# Patient Record
Sex: Female | Born: 1970 | Race: White | Hispanic: No | Marital: Married | Smoking: Never smoker
Health system: Southern US, Community
[De-identification: ages and names within clinical notes are randomized; demographics above are authoritative.]

## PROBLEM LIST (undated history)

## (undated) DIAGNOSIS — N200 Calculus of kidney: Secondary | ICD-10-CM

## (undated) HISTORY — PX: OVARIAN CYST REMOVAL: SHX89

## (undated) HISTORY — DX: Calculus of kidney: N20.0

---

## 2002-09-29 ENCOUNTER — Other Ambulatory Visit: Admission: RE | Admit: 2002-09-29 | Discharge: 2002-09-29 | Payer: Self-pay | Admitting: Obstetrics and Gynecology

## 2003-11-10 ENCOUNTER — Ambulatory Visit (HOSPITAL_BASED_OUTPATIENT_CLINIC_OR_DEPARTMENT_OTHER): Admission: RE | Admit: 2003-11-10 | Discharge: 2003-11-10 | Payer: Self-pay | Admitting: Urology

## 2003-11-10 IMAGING — CR DG ABDOMEN 1V
1 series · 1 of 1 positions shown · non-contrast
Comparison: none

CLINICAL DATA: UPJ stone.
 ABDOMEN, ONE VIEW
 There is a 4 x 5 mm stone in the region of the proximal left ureter at the region of the left L2 transverse process.  No other renal calculi are identified.  The bowel gas pattern is normal. 
 IMPRESSION
 4 x 5 mm calculus proximal left ureter.

[view not recorded]
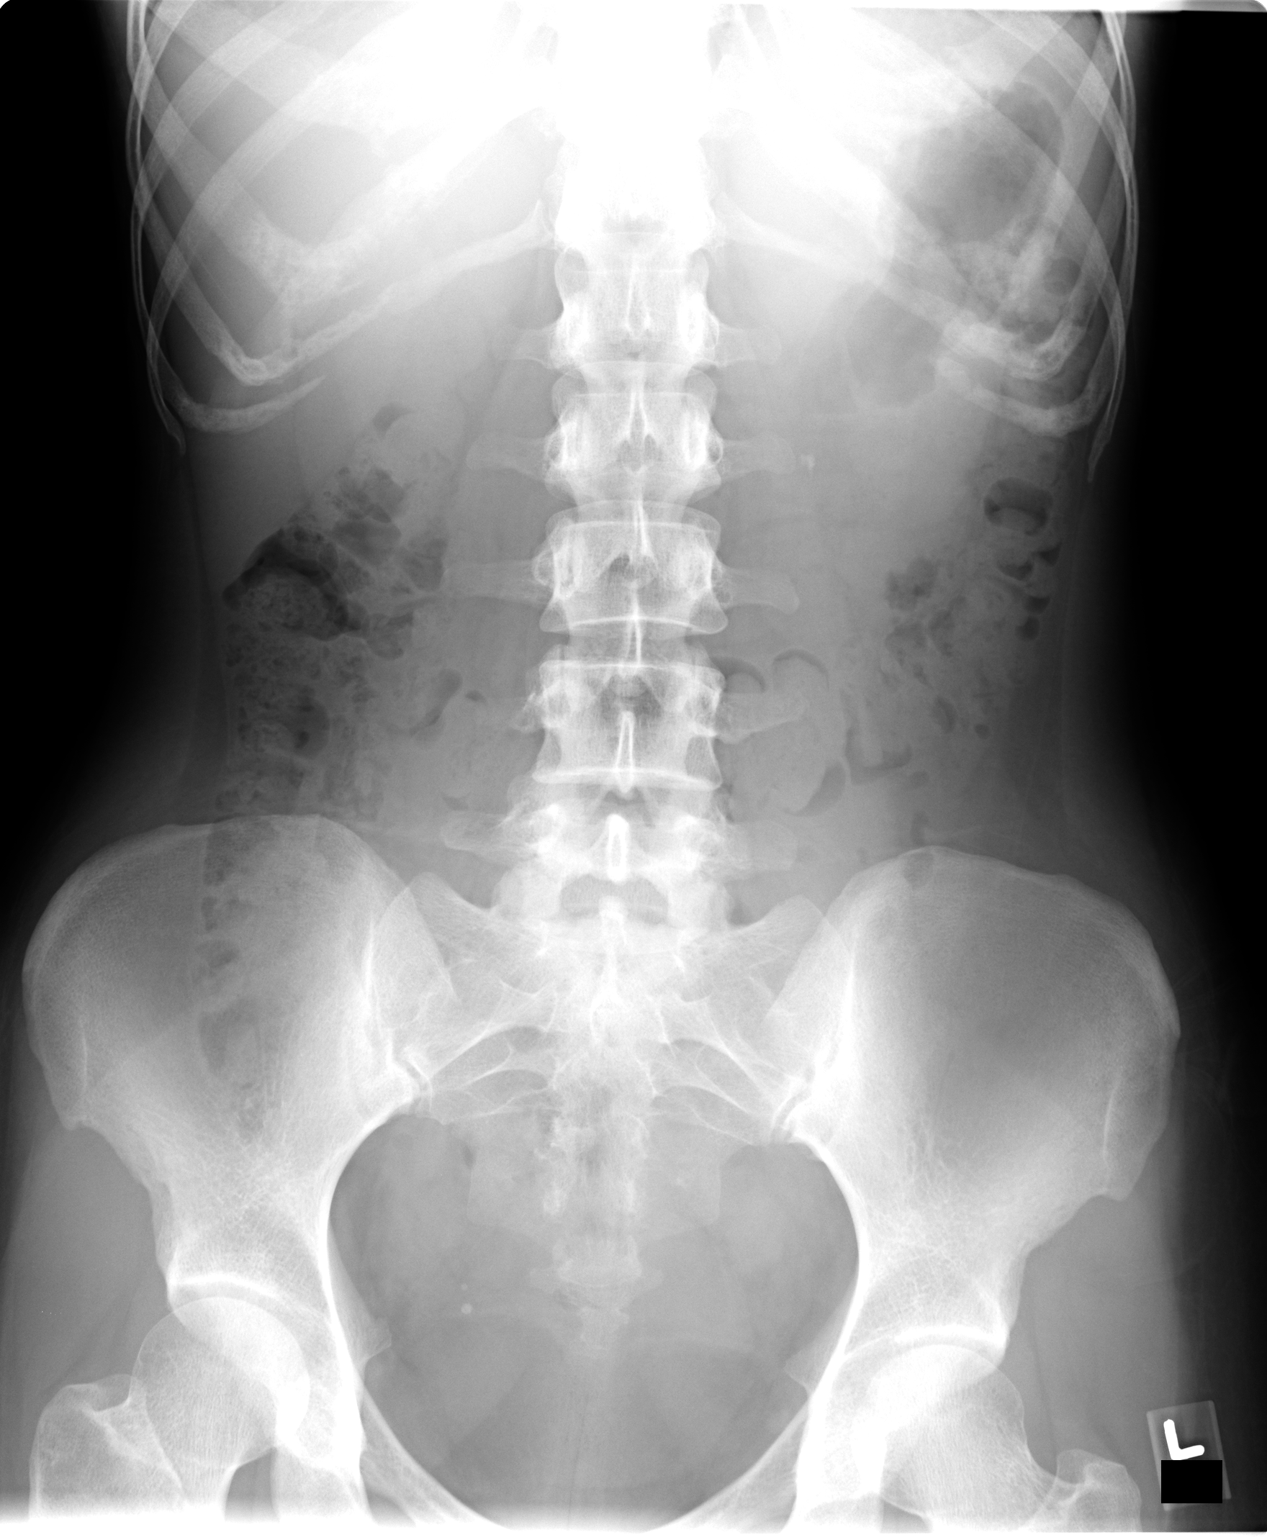

[1 of 1 positions shown; findings below may reference images not displayed]

## 2003-11-11 ENCOUNTER — Ambulatory Visit (HOSPITAL_COMMUNITY): Admission: RE | Admit: 2003-11-11 | Discharge: 2003-11-11 | Payer: Self-pay | Admitting: Urology

## 2003-11-11 IMAGING — CR DG ABDOMEN 1V
1 series · 1 of 1 positions shown · non-contrast
Comparison: [DATE].

CLINICAL DATA: Left flank pain.
 ABDOMEN, ONE VIEW (KUB)

[view not recorded]
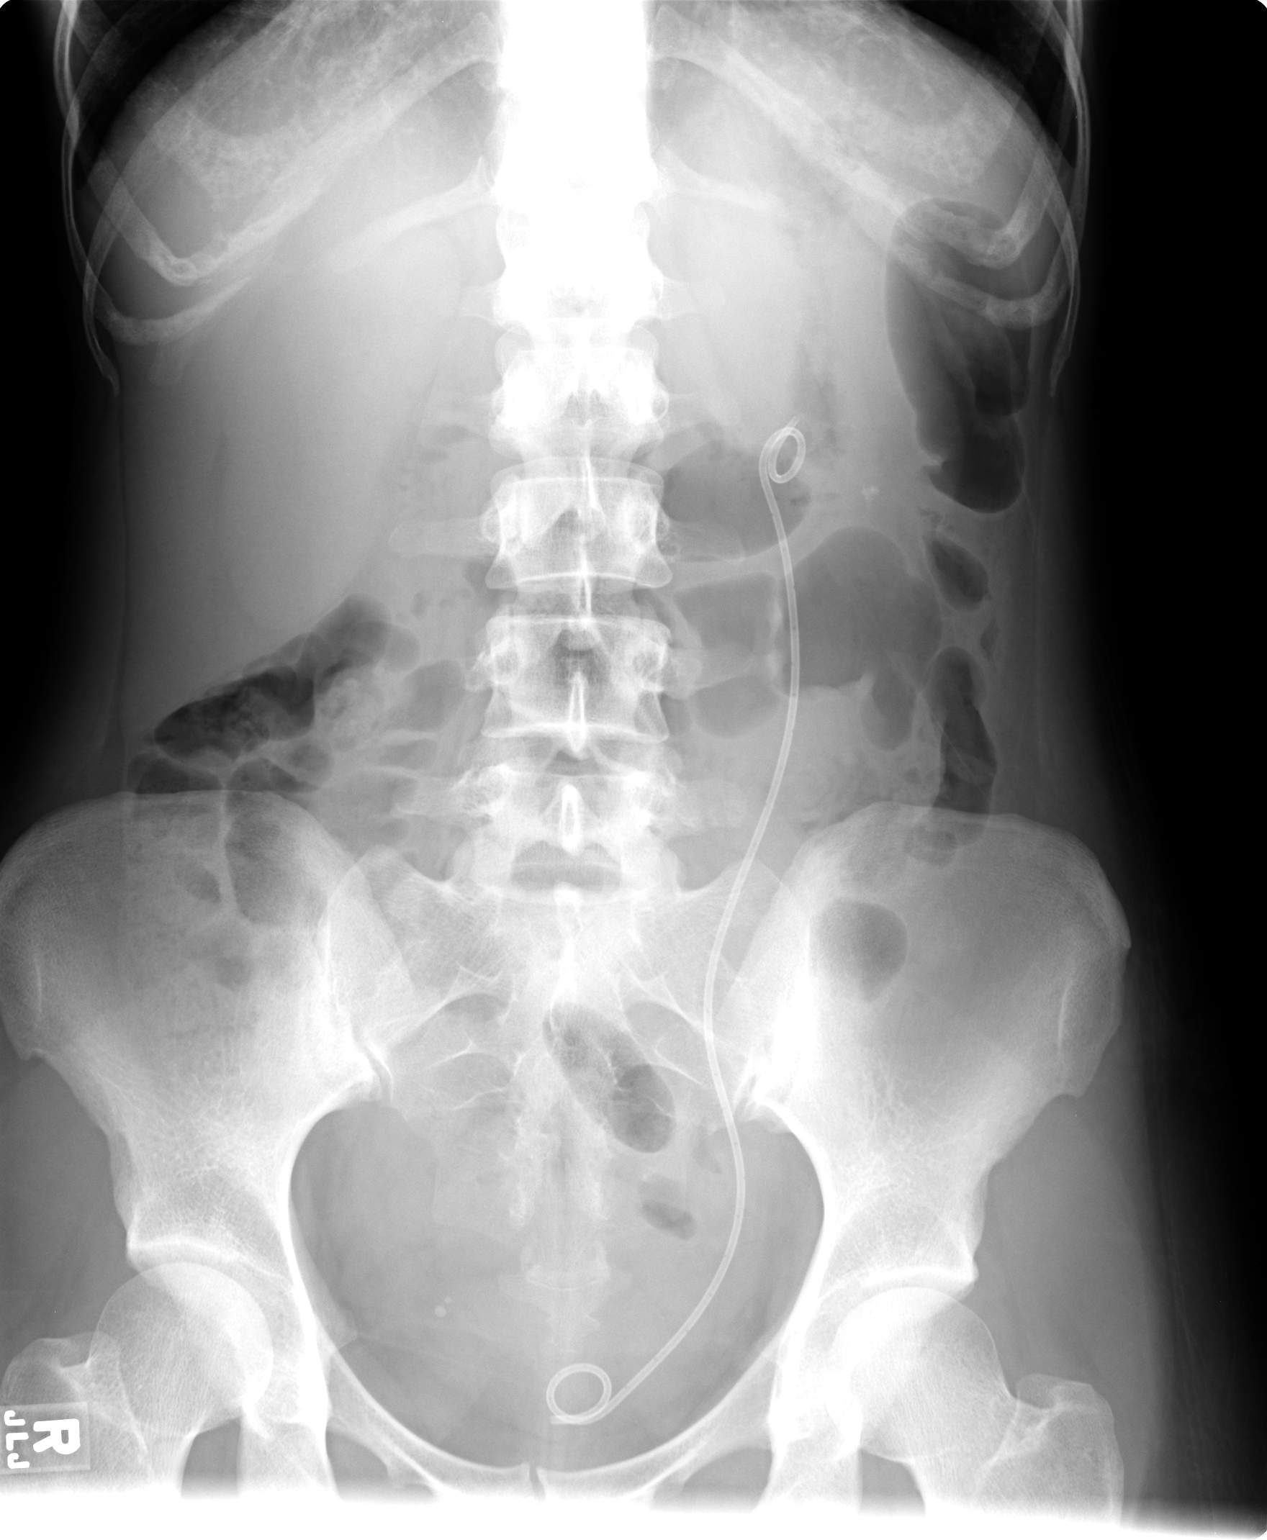

[1 of 1 positions shown; findings below may reference images not displayed]

A left double-J ureteral stent has been placed.  The calculus that was in the proximal left ureter is now positioned in a lower pole calyx of the left kidney. 
 IMPRESSION
 1.  Status post left double-J stent placement. 
 2.  Former proximal left ureteral calculus is now positioned in the lower pole of the left kidney.

## 2006-01-28 ENCOUNTER — Encounter: Admission: RE | Admit: 2006-01-28 | Discharge: 2006-04-28 | Payer: Self-pay | Admitting: Otolaryngology

## 2006-08-23 ENCOUNTER — Observation Stay (HOSPITAL_COMMUNITY): Admission: EM | Admit: 2006-08-23 | Discharge: 2006-08-23 | Payer: Self-pay | Admitting: Emergency Medicine

## 2006-09-04 ENCOUNTER — Emergency Department (HOSPITAL_COMMUNITY): Admission: EM | Admit: 2006-09-04 | Discharge: 2006-09-04 | Payer: Self-pay | Admitting: Emergency Medicine

## 2006-09-04 IMAGING — CT CT HEAD W/O CM
1 of 2 series · 16 of 30 positions shown, 20 images · IV contrast (agent unspecified)
Comparison: none

CLINICAL DATA: Altered mental status.  Confusion.  
 HEAD CT WITHOUT CONTRAST:
TECHNIQUE: Contiguous axial images were obtained from the base of the skull through the vertex according to standard protocol without contrast.

[Series 2: headseq 4.8 h45s · axial · 0.43mm/px · z∈[+1177,+1301]mm · 16 of 30 slices shown, 20 images]
[im 2/30  brain]
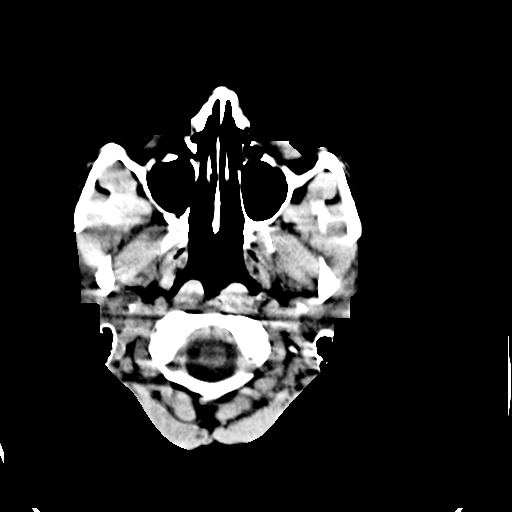
[im 2/30  bone]
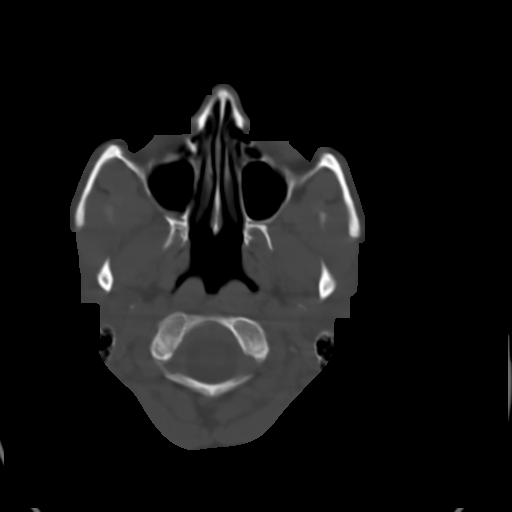
[im 3/30  brain]
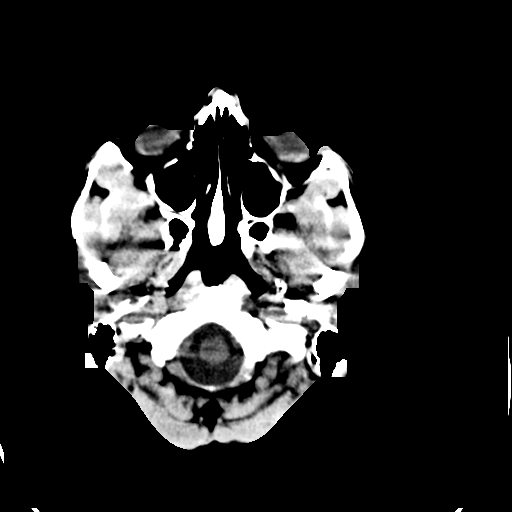
[im 5/30  brain]
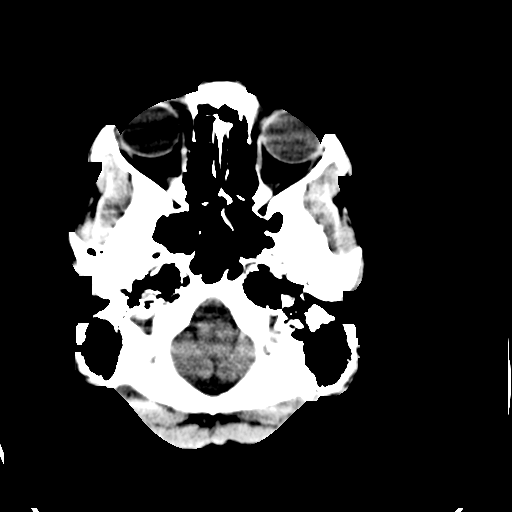
[im 8/30  brain]
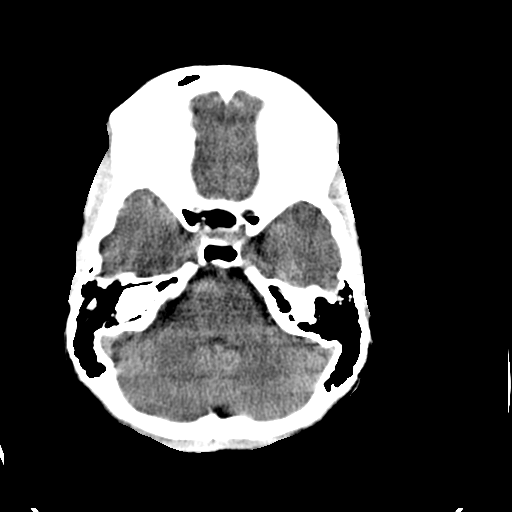
[im 9/30  brain]
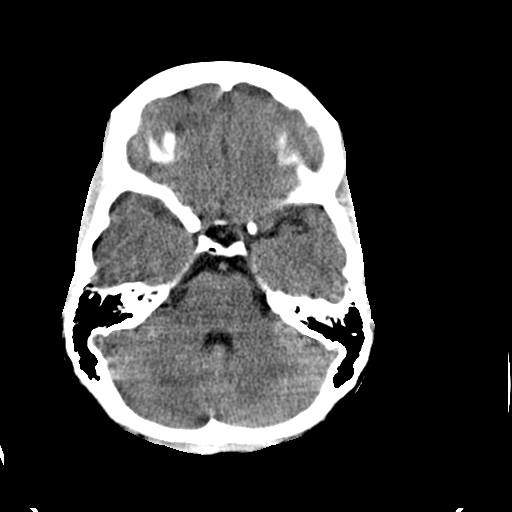
[im 9/30  bone]
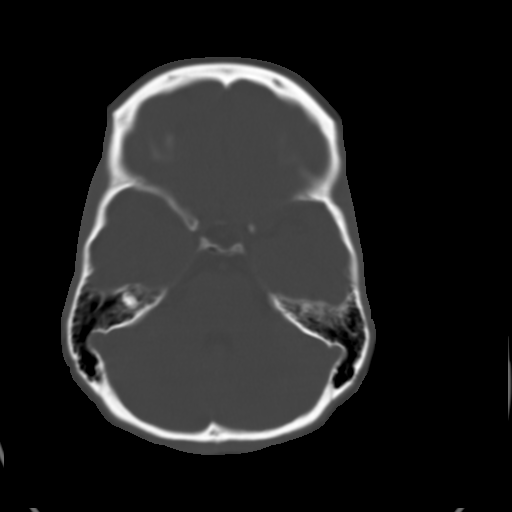
[im 11/30  brain]
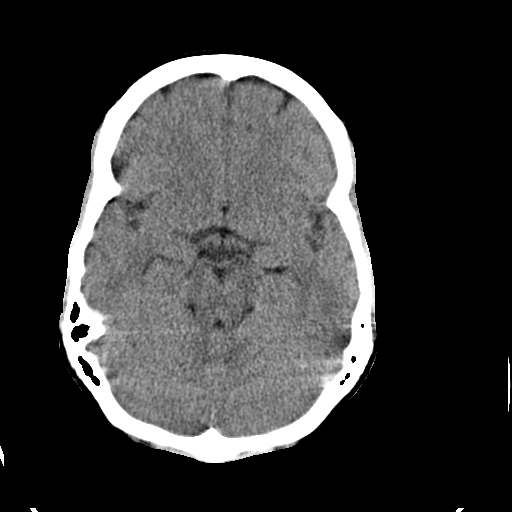
[im 12/30  brain]
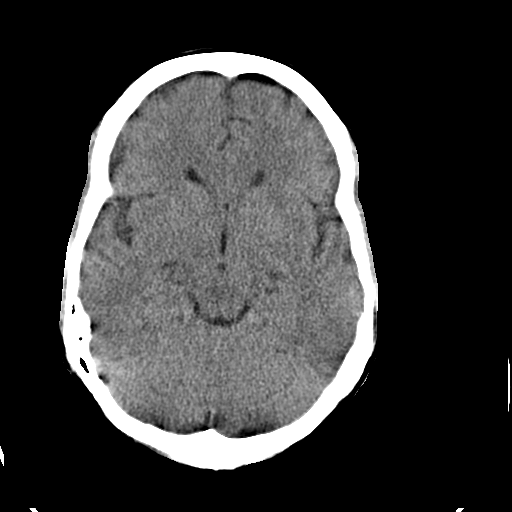
[im 14/30  brain]
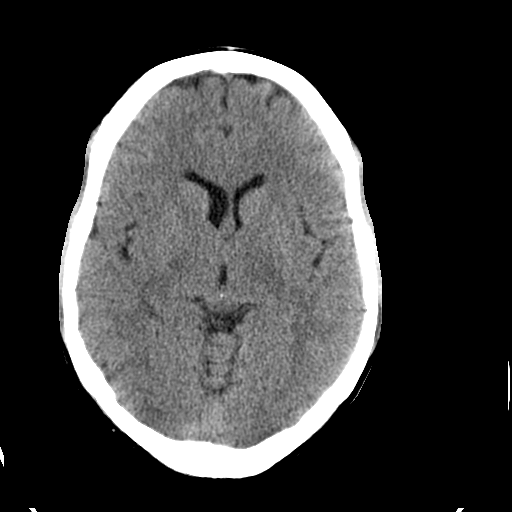
[im 16/30  brain]
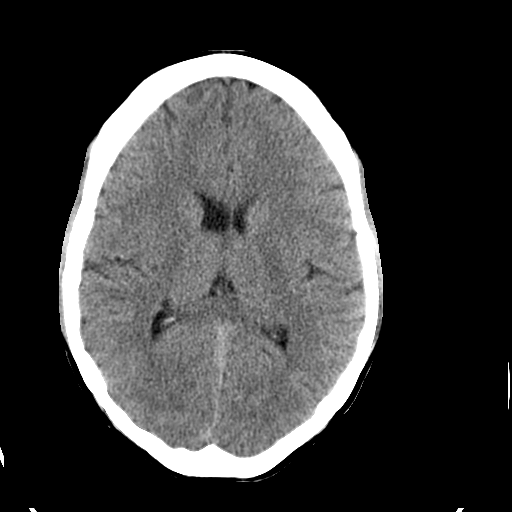
[im 16/30  bone]
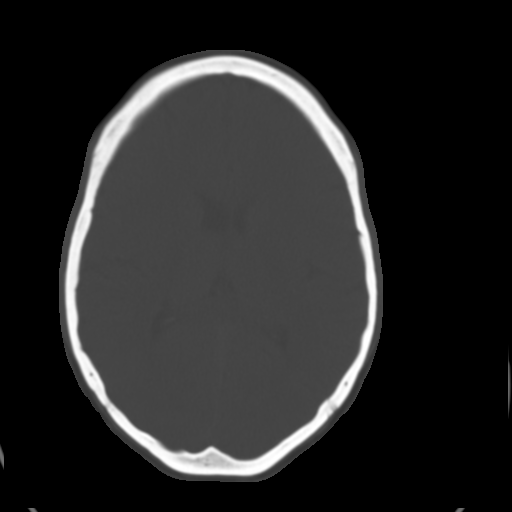
[im 18/30  brain]
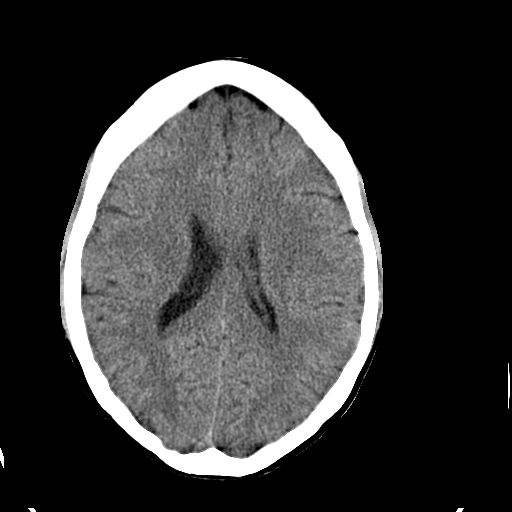
[im 19/30  brain]
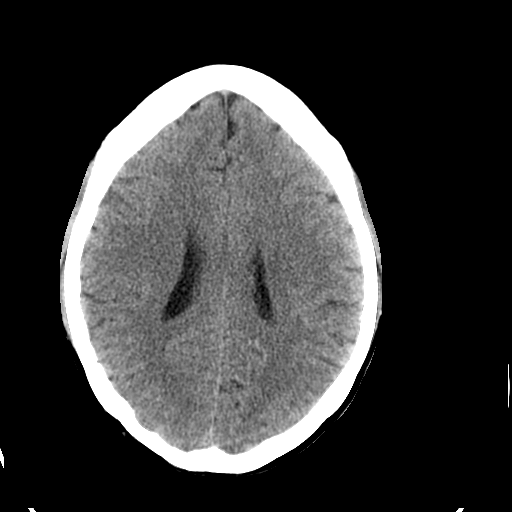
[im 21/30  brain]
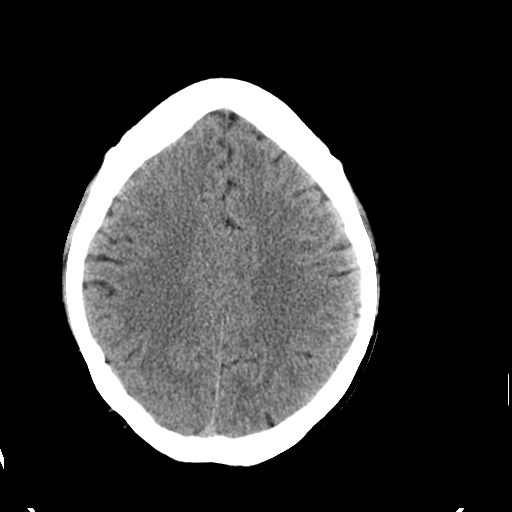
[im 22/30  brain]
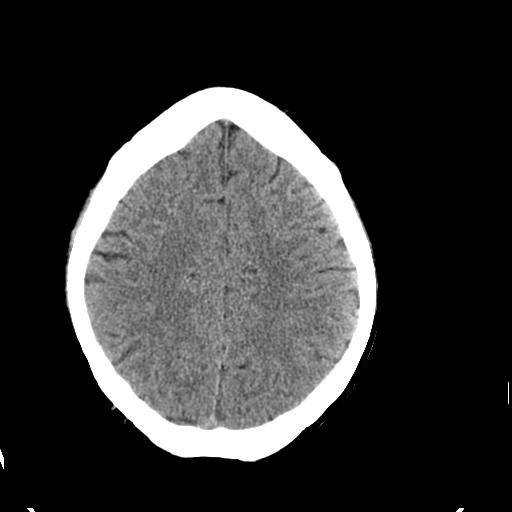
[im 22/30  bone]
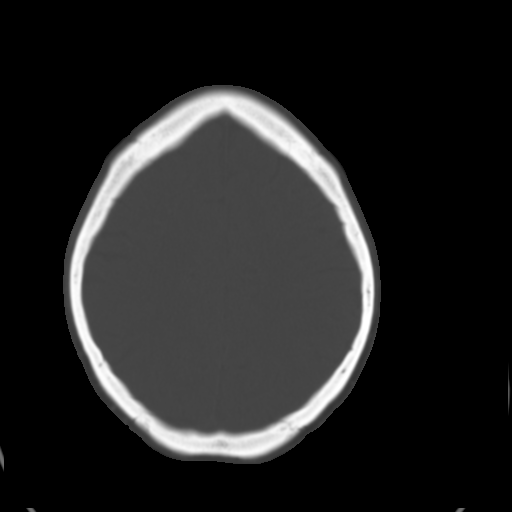
[im 25/30  brain]
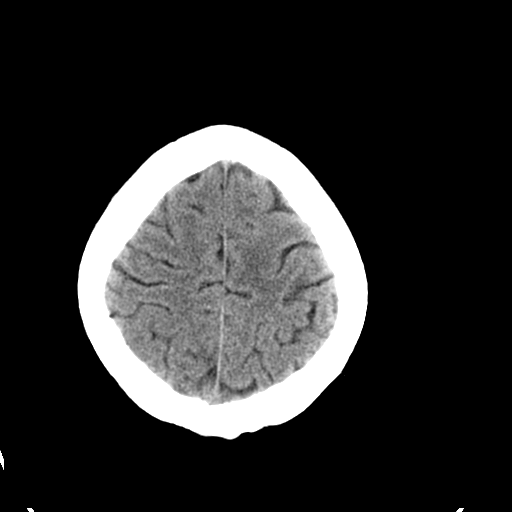
[im 27/30  brain]
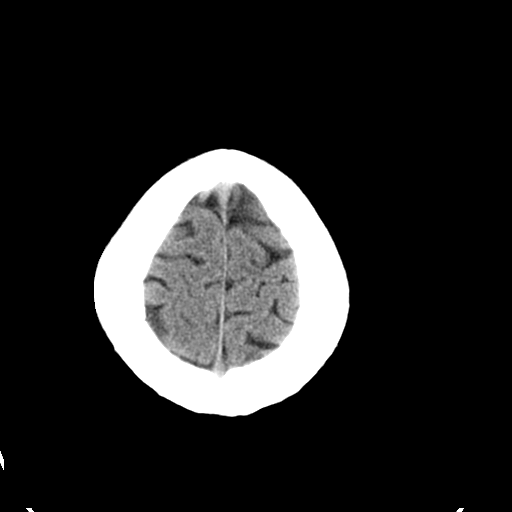
[im 28/30  brain]
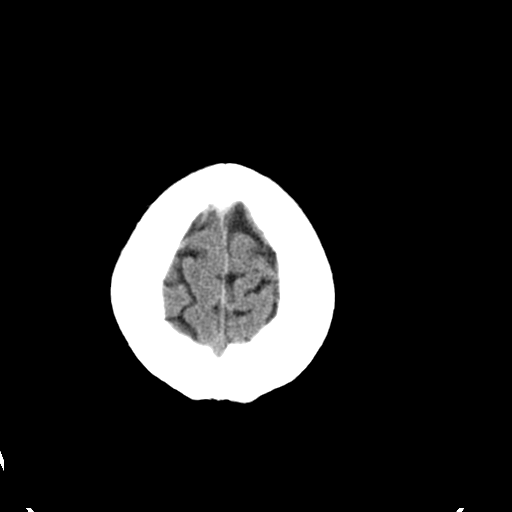

[16 of 30 positions shown; findings below may reference images not displayed]

FINDINGS: There is no evidence of intracranial hemorrhage, brain edema, acute infarct, mass lesion, or mass effect.  No other intra-axial abnormalities are seen, and the ventricles are within normal limits.  No abnormal extra-axial fluid collections or masses are identified.  No skull abnormalities are noted.
IMPRESSION: Negative non-contrast head CT.

## 2006-09-05 ENCOUNTER — Ambulatory Visit: Payer: Self-pay | Admitting: *Deleted

## 2006-09-05 ENCOUNTER — Inpatient Hospital Stay (HOSPITAL_COMMUNITY): Admission: AD | Admit: 2006-09-05 | Discharge: 2006-09-12 | Payer: Self-pay | Admitting: *Deleted

## 2007-02-27 HISTORY — PX: KNEE ARTHROSCOPY W/ ACL RECONSTRUCTION: SHX1858

## 2010-07-14 NOTE — Discharge Summary (Signed)
Kristina Hahn, Kristina Hahn              ACCOUNT NO.:  [id number]   MEDICAL RECORD NO.:  [id number]          PATIENT TYPE:  IPS   LOCATION:  0303                          FACILITY:  [facility]   PHYSICIAN:  Jasmine Pang, M.D. DATE OF BIRTH:  16-Aug-1970   DATE OF ADMISSION:  09/05/2006  DATE OF DISCHARGE:  09/12/2006                               DISCHARGE SUMMARY   IDENTIFICATION:  [age]-year-old white female who had ACL surgery  on August 24, 2006, and since then has been displaying bizarre behaviors.  She was admitted on a voluntary basis to our unit on August 05, 2006.   HISTORY OF PRESENT ILLNESS:  As indicated above, the patient had ACL  surgery on August 24, 2006, and since then she has been displaying bizarre  behaviors.  She has been having delusional thoughts (religious in nature  - she believes she is pregnant via maculate conception.  She believes  she has the gift of).  Her husband reports she has been preoccupied with  trying to assemble this divine puzzle for the past several days.  She  was found by the [facility] police in a stairwell on campus rambling  about God.  She had gone to watch her daughters at basketball camp, but  got lost.  She had no prior psychiatric history.  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  She  has no significant medical problems other than a rash over her frontal  part of her torso (? allergic) and the ACL surgery on June 26.  She has  had a history of kidney stone surgery 8 years ago.  She states she is  ALLERGIC TO PERCOCET (causes nausea).  She has a lot of support from her  husband and children and mother.  She was on various pain medications  after the knee surgery, but husband states she is not taking any of them  as she did not like the way it made her feel.  She denies use of drugs  or alcohol.  Another stressor is that her father is reportedly dying.   PHYSICAL FINDINGS:  Physical exam was  done in the ED.  There were no  acute physical abnormalities except for a severe rash over her torso  area that appeared to be due to an allergic reaction to one of the  recent meds she has taken.   ADMISSION LABORATORIES:  Basic metabolic panel was within normal limits.  Urinalysis was negative.  Alcohol level was less than 5.  HCG was  negative.   HOSPITAL COURSE:  Upon admission, the patient was initially placed on  Geodon 20 mg IM q.12 hours p.r.n. agitation, and Ativan 1 mg p.o. q.6  hours p.r.n. agitation or anxiety.  She was also started on Ambien 10 mg  p.o. nightly p.r.n. anxiety.  On September 05, 2006, the patient was given  Zyprexa Zydis 5 mg now due to agitation.  On September 05, 2006, she was  placed on Claritin 10 mg daily and Benadryl 25 mg p.o. q.i.d. for her  rash.  The methocarbamol that she was on was discontinued secondary to  her rash of unknown etiology.  She was started on Geodon 80 mg p.o.  b.i.d. with food, and Geodon 20 mg IM x1 p.r.n. severe agitation.  The  ketorolac was also discontinued secondary to her rash.  On September 05, 2006, the patient was started on Depakote ER 250 mg p.o. b.i.d. with  food.  On September 07, 2006, she was given Benadryl 50 mg p.o. now for her  rash, and 1% cortisone cream to rash b.i.d. p.r.n. discomfort or  itching.  On September 09, 2006, Depakote was increased to Depakote ER 500 mg  p.o. b.i.d..  On September 11, 2006, due to severe sedation, the a.m. Geodon  was discontinued.  She was continued on Geodon 80 mg p.o. nightly.  The  patient tolerated her medications though she was sleepy.  She was fairly  sedate at first.  Initially, she was very manic, unable to cooperate  with the initial interview.  She was drawing diagrams related to God.  She became very agitated.  She had to have Geodon 20 mg IM, at which  point, Geodon 80 mg p.o. b.i.d. was also started.  She continued to be  manic and hyperreligious.  She spoke gibberish at times, repeating  words  and numbers.  She was very manic and very labile.  Her mood was euphoric  at times, but then became more depressed with anxiety as the  hospitalization progressed.  She became tearful when talking about her  children.  There was no suicidal or homicidal ideation.  There were  positive auditory hallucinations.  She stated she was hearing voices at  times.  She would also get confused and ask me where our operating room  was.  She stated she had heard an overhead announcement telling her that  there was an operating room on our unit.  She is very labile, crying at  times, sobbing loudly.  She was disorganized and tangential.  On September 10, 2006, her mood began to improve.  She was less depressed and less  euphoric.  Mood was more euthymic.  Affect wide range.  No suicidal or  homicidal ideation.  No auditory or visual hallucinations anymore.  The  rash was improving.  Mood continued to improve, and on September 11, 2006,  she stated I feel like I'm clear again.  She was able to attend unit  therapeutic groups and activities appropriately without interrupting  them.  I spoke at length with patient's husband to explain her longer-  term treatment once discharged from our unit.  We also discussed the  possible precipitant for this psychiatric decompensation (probably of  the ACL surgery or something associated with this).  On September 12, 2006,  mental status had improved markedly from admission status.  The patient  was friendly and cooperative with good eye contact.  Speech was normal  rate and flow.  Psychomotor activity was within normal limits.  Mood was  euthymic.  Affect wide range.  No suicidal or homicidal ideation.  No  thoughts of self-injurious behavior.  No auditory or visual  hallucinations.  No paranoia or delusions.  Thoughts were logical and  goal-directed.  Thought content no predominant theme.  The cognitive was  grossly back to baseline.  An a.m. Depakote level on September 11, 2006  was  85.2 (50-100).  A TSH was 1.080, which was within normal limits.  Hepatic function panels were grossly within normal  limits, as her  treatment with Depakote progressed.  CBCs were within normal limits  except for slightly elevated platelet count on September 11, 2006, of 481  (150-400).  Basic metabolic panel was within normal limits during the  hospitalization.  It was felt patient was safe to return home to her  husband, who planned to pick her up.  They plan to have someone with her  at all times for a while, and her mother was planning to stay with her  most of the time.   DISCHARGE DIAGNOSES:  AXIS I:  Psychotic disorder not otherwise  specified (rule out bipolar disorder, manic).  AXIS II:  None.  AXIS III:  Recent anterior cruciate ligament surgery on August 22, 2006.  Recent upper torso body rash which appeared to be an allergic reaction.  AXIS IV:  Severe (recent surgery, father's terminal illness).  AXIS V:  Global assessment of functioning upon discharge was 50.  Global  assessment of functioning upon admission was 25.  Global assessment of  functioning highest past year was 75-80.   DISCHARGE PLANS:  There were no specific activity level or dietary  restrictions.   POST-HOSPITAL CARE PLANS:  The patient will see Dr. Andee Poles.  This is being set up by our case manager.   DISCHARGE MEDICATIONS:  1. Geodon 80 mg p.o. nightly.  2. Depakote ER 500 mg p.o. b.i.d.      Jasmine Pang, M.D.  Electronically Signed     BHS/MEDQ  D:  09/24/2006  T:  09/25/2006  Job:  [id number]

## 2010-07-14 NOTE — Op Note (Signed)
NAME:  Kristina Hahn, Kristina Hahn NO.:  [id number]   MEDICAL RECORD NO.:  [id number]                   PATIENT TYPE:  AMB   LOCATION:  [facility]                                 FACILITY:  [facility]   PHYSICIAN:  Boston Service, M.D.             DATE OF BIRTH:  [birth year]   DATE OF PROCEDURE:  11/10/2003  DATE OF DISCHARGE:                                 OPERATIVE REPORT   REFERRED BY:  Debbora Dus, M.D., Lenoard Aden, M.D.   UROLOGIST:  Boston Service, M.D.   PREOPERATIVE DIAGNOSES:  9 mm left ureteropelvic junction stone.   POSTOPERATIVE DIAGNOSES:  9 mm left ureteropelvic junction stone.   PROCEDURE:  Cystoscopy retrograde, double J stent placement.   ANESTHESIA:  General.   DRAINS:  6 French 24 cm double J stent.   DESCRIPTION OF PROCEDURE:  The patient was prepped and draped in the dorsal  lithotomy position after institution of an adequate level of general  anesthesia. A well lubricated 21 French panendoscope was gently inserted at  the urethral meatus, normal urethra and sphincter.  Normal trigone and  orifices.  Right retrograde, normal course and caliber of the ureter, pelvis  and calices with prompt drainage at 3-5 minutes. Left retrograde showed  filling defect at the left UPJ consistent with 9 mm calculus, proximal  hydronephrosis. Gentle advancement of the endhole catheter beneath the stone  with gentle injection of a combination of water and contrast displaced stone  into the upper pole calices. The guidewire was then advanced into the upper  pole calices, coiled nicely, 6 French 24 cm double J stent was advanced over  the guidewire with excellent pigtail formation on guidewire removal. The  bladder was drained, cystoscope was removed. The patient was given a B&O  suppository and returned to recovery in satisfactory condition.                                               Boston Service, M.D.    RH/MEDQ  D:  11/10/2003  T:  11/10/2003   Job:  [id number]   cc:   Lenoard Aden, M.D.  [address]  [place]  [place] [address]  Fax: [phone]   Debbora Dus, M.D.

## 2010-12-11 LAB — HEPATIC FUNCTION PANEL
ALT: 14
AST: 15
Albumin: 3.8
Alkaline Phosphatase: 55
Bilirubin, Direct: 0.1
Indirect Bilirubin: 0.4
Total Bilirubin: 0.5
Total Protein: 7.7

## 2010-12-11 LAB — CBC
HCT: 42.6
Hemoglobin: 14.3
MCHC: 33.6
MCV: 93.4
Platelets: 481 — ABNORMAL HIGH
RBC: 4.56
RDW: 12.8
WBC: 9.2

## 2010-12-11 LAB — VALPROIC ACID LEVEL: Valproic Acid Lvl: 85.2

## 2010-12-12 LAB — URINE MICROSCOPIC-ADD ON

## 2010-12-12 LAB — CBC
HCT: 36.8
HCT: 38.3
Hemoglobin: 12.3
Hemoglobin: 13
MCHC: 33.6
MCHC: 34.1
MCV: 93.2
MCV: 94.2
Platelets: 398
Platelets: 444 — ABNORMAL HIGH
RBC: 3.91
RBC: 4.11
RDW: 13.1
RDW: 13.1
WBC: 8.6
WBC: 9.1

## 2010-12-12 LAB — URINALYSIS, ROUTINE W REFLEX MICROSCOPIC
Bilirubin Urine: NEGATIVE
Bilirubin Urine: NEGATIVE
Glucose, UA: NEGATIVE
Glucose, UA: NEGATIVE
Ketones, ur: NEGATIVE
Ketones, ur: NEGATIVE
Nitrite: NEGATIVE
Nitrite: NEGATIVE
Protein, ur: NEGATIVE
Protein, ur: NEGATIVE
Specific Gravity, Urine: 1.01
Specific Gravity, Urine: 1.017
Urobilinogen, UA: 0.2
Urobilinogen, UA: 0.2
pH: 6
pH: 5.5

## 2010-12-12 LAB — DIFFERENTIAL
Basophils Absolute: 0.2 — ABNORMAL HIGH
Basophils Relative: 2 — ABNORMAL HIGH
Eosinophils Absolute: 0.2
Eosinophils Relative: 3
Lymphocytes Relative: 28
Lymphs Abs: 2.6
Monocytes Absolute: 0.7
Monocytes Relative: 7
Neutro Abs: 5.4
Neutrophils Relative %: 60

## 2010-12-12 LAB — BASIC METABOLIC PANEL
BUN: 14
CO2: 25
Calcium: 9.7
Chloride: 104
Creatinine, Ser: 0.8
GFR calc Af Amer: >60
GFR calc non Af Amer: >60
Glucose, Bld: 122 — ABNORMAL HIGH
Potassium: 3.2 — ABNORMAL LOW
Sodium: 140

## 2010-12-12 LAB — TSH: TSH: 1.08

## 2010-12-12 LAB — COMPREHENSIVE METABOLIC PANEL
ALT: 13
ALT: 13
AST: 15
AST: 16
Albumin: 3.4 — ABNORMAL LOW
Albumin: 3.6
Alkaline Phosphatase: 46
Alkaline Phosphatase: 49
BUN: 6
BUN: 8
CO2: 26
CO2: 27
Calcium: 9.2
Calcium: 9.4
Chloride: 105
Chloride: 107
Creatinine, Ser: 0.59
Creatinine, Ser: 0.59
GFR calc Af Amer: >60
GFR calc Af Amer: >60
GFR calc non Af Amer: >60
GFR calc non Af Amer: >60
Glucose, Bld: 92
Glucose, Bld: 99
Potassium: 3.5
Potassium: 4.2
Sodium: 140
Sodium: 142
Total Bilirubin: 0.5
Total Bilirubin: 0.6
Total Protein: 6.5
Total Protein: 6.7

## 2010-12-12 LAB — RAPID URINE DRUG SCREEN, HOSP PERFORMED
Amphetamines: NOT DETECTED
Barbiturates: NOT DETECTED
Benzodiazepines: NOT DETECTED
Cocaine: NOT DETECTED
Opiates: NOT DETECTED
Tetrahydrocannabinol: NOT DETECTED

## 2010-12-12 LAB — PREGNANCY, URINE
Preg Test, Ur: NEGATIVE
Preg Test, Ur: NEGATIVE

## 2010-12-12 LAB — SEDIMENTATION RATE: Sed Rate: 15

## 2010-12-12 LAB — RPR: RPR Ser Ql: NONREACTIVE

## 2010-12-12 LAB — ETHANOL: Alcohol, Ethyl (B): <5

## 2011-06-29 ENCOUNTER — Telehealth (INDEPENDENT_AMBULATORY_CARE_PROVIDER_SITE_OTHER): Payer: Self-pay | Admitting: General Surgery

## 2011-06-29 NOTE — Telephone Encounter (Signed)
Pt calling to ask to be called-in earlier if a cancellation or other opening occurs.  She has appt 07/06/11 for painful hems, but says they are becoming intolerable.  Call cell # [phone] or wk (9am-1:30pm) [phone].  Thanks.

## 2011-07-06 ENCOUNTER — Encounter (INDEPENDENT_AMBULATORY_CARE_PROVIDER_SITE_OTHER): Payer: Self-pay | Admitting: General Surgery

## 2011-07-06 ENCOUNTER — Ambulatory Visit (INDEPENDENT_AMBULATORY_CARE_PROVIDER_SITE_OTHER): Payer: BC Managed Care – PPO | Admitting: General Surgery

## 2011-07-06 VITALS — BP 110/46 | HR 80 | Temp 97.1°F | Resp 18 | Ht 63.0 in | Wt 109.0 lb

## 2011-07-06 DIAGNOSIS — K648 Other hemorrhoids: Secondary | ICD-10-CM

## 2011-07-06 DIAGNOSIS — K644 Residual hemorrhoidal skin tags: Secondary | ICD-10-CM

## 2011-07-06 MED ORDER — HYDROCORTISONE ACETATE 25 MG RE SUPP: 25.0000 mg | Freq: Two times a day (BID) | RECTAL | Status: AC

## 2011-07-06 NOTE — Patient Instructions (Signed)
Hemorrhoids  Hemorrhoids are enlarged (dilated) veins around the rectum. There are 2 types of hemorrhoids, and the type of hemorrhoid is determined by its location. Internal hemorrhoids occur in the veins just inside the rectum.They are usually not painful, but they may bleed.However, they may poke through to the outside and become irritated and painful. External hemorrhoids involve the veins outside the anus and can be felt as a painful swelling or hard lump near the anus.They are often itchy and may crack and bleed. Sometimes clots will form in the veins. This makes them swollen and painful. These are called thrombosed hemorrhoids. CAUSES Causes of hemorrhoids include:  Pregnancy. This increases the pressure in the hemorrhoidal veins.   Constipation.   Straining to have a bowel movement.   Obesity.   Heavy lifting or other activity that caused you to strain.   TREATMENT Most of the time hemorrhoids improve in 1 to 2 weeks. However, if symptoms do not seem to be getting better or if you have a lot of rectal bleeding, your caregiver may perform a procedure to help make the hemorrhoids get smaller or remove them completely.Possible treatments include:  Rubber band ligation. A rubber band is placed at the base of the hemorrhoid to cut off the circulation.   Sclerotherapy. A chemical is injected to shrink the hemorrhoid.   Infrared light therapy. Tools are used to burn the hemorrhoid.   Hemorrhoidectomy. This is surgical removal of the hemorrhoid.   HOME CARE INSTRUCTIONS   Increase fiber in your diet. Ask your caregiver about using fiber supplements.   Drink enough water and fluids to keep your urine clear or pale yellow.   Exercise regularly.   Go to the bathroom when you have the urge to have a bowel movement. Do not wait.   Avoid straining to have bowel movements.   Keep the anal area dry and clean.   Only take over-the-counter or prescription medicines for pain,  discomfort, or fever as directed by your caregiver.   Take warm sitz baths for 20 to 30 minutes, 3 to 4 times per day.   If the hemorrhoids are very tender and swollen, place ice packs on the area as tolerated. Using ice packs between sitz baths may be helpful. Fill a plastic bag with ice. Place a towel between the bag of ice and your skin.   Medicated creams and suppositories may be used or applied as directed.   Do not use a donut-shaped pillow or sit on the toilet for long periods. This increases blood pooling and pain.   SEEK MEDICAL CARE IF:   You have increasing pain and swelling that is not controlled with your medicine.   You have uncontrolled bleeding.   You have difficulty or you are unable to have a bowel movement.   You have pain or inflammation outside the area of the hemorrhoids.   You have chills or an oral temperature above 102 F (38.9 C).     

## 2011-07-06 NOTE — Progress Notes (Signed)
CC:  Hemorrhoids  HISTORY: Patient is a 41 year old female has been having issues with rectal discomfort for 2 months. She describes a sensation of discomfort, pain, and seepage, and a sensation of things coming out and going back in. She has not had to push any hemorrhoids back in. Denies constipation. She eats vegetables and does not sit on toilet for a long time. She also complains of having a significant yeast infection in her perineum. She denies significant issues of bleeding. She has tried warm water baths. She has tried Preparation H and Anusol.  She was diagnosed with Crohn's at a holistic medical center in [place], [place].  She was placed on colonic irrigation by this physician. Her symptoms are bloating and crampy abdominal pain. She stopped seeing this physician because of monetary issues and time issues.    Past Medical History  Diagnosis Date  . Kidney stones     Past Surgical History  Procedure Date  . Ovarian cyst removal     right    Current Outpatient Prescriptions  Medication Sig Dispense Refill  . hydrocortisone (ANUSOL-HC) 2.5 % rectal cream Place rectally 2 (two) times daily.      . hydrocortisone cream 1 % Apply topically 2 (two) times daily.      . Multiple Vitamin (MULTI-VITAMIN DAILY PO) Take by mouth.      . nystatin ointment (MYCOSTATIN) Apply topically 2 (two) times daily.         Allergies not on file   History reviewed. No pertinent family history.   History   Social History  . Marital Status: Married    Spouse Name: N/A    Number of Children: N/A  . Years of Education: N/A   Social History Main Topics  . Smoking status: Never Smoker   . Smokeless tobacco: None  . Alcohol Use: No  . Drug Use: No  . Sexually Active:    Other Topics Concern  . None   Social History Narrative  . None     REVIEW OF SYSTEMS - PERTINENT POSITIVES ONLY: 12 point review of systems negative other than HPI and PMH EXAM: Filed Vitals:   07/06/11 0856    BP: 110/46  Pulse: 80  Temp: 97.1 F (36.2 C)  Resp: 18    Gen:  No acute distress.  Well nourished and well groomed.   Neurological: Alert and oriented to person, place, and time. Coordination normal.  Head: Normocephalic and atraumatic.  Eyes: Conjunctivae are normal. Pupils are equal, round, and reactive to light. No scleral icterus.  Cardiovascular: Normal rate. Respiratory: Effort normal.  No respiratory distress.  GI: Soft.  Musculoskeletal: Normal range of motion. Extremities are nontender.  Rectal exam:  Small external hemorrhoids.  No evidence of fissure or sentinel tag.  Anoscopy reveals large posterior hemorrhoids that are injected times 2.  Anteriorly, the hemorrhoids are smaller.   Skin: Skin is warm and dry. No rash noted. No diaphoresis. No erythema. No pallor. No clubbing, cyanosis, or edema.   Psychiatric: Normal mood and affect. Behavior is normal. Judgment and thought content normal.     ASSESSMENT AND PLAN: Patient has internal prolapsing and external hemorrhoids. I have given her a prescription for hydrocortisone suppositories to treat internal hemorrhoids in addition to the injections. She should continue Anusol cream on the outside hemorrhoids. If she can get an applicator she can use this internally as well. She is given ER instruction booklet. She is advised to get a sitz bath at [facility].  She'll followup in 6 weeks.   Maudry Diego MD Surgical Oncology, General and Endocrine Surgery [facility]      Visit Diagnoses: 1. Internal and external prolapsed hemorrhoids     Primary Care Physician: No primary provider on file.

## 2011-08-21 ENCOUNTER — Encounter (INDEPENDENT_AMBULATORY_CARE_PROVIDER_SITE_OTHER): Payer: BC Managed Care – PPO | Admitting: General Surgery

## 2012-06-23 ENCOUNTER — Encounter (HOSPITAL_COMMUNITY): Payer: Self-pay | Admitting: Nurse Practitioner

## 2012-06-23 ENCOUNTER — Emergency Department (HOSPITAL_COMMUNITY)
Admission: EM | Admit: 2012-06-23 | Discharge: 2012-06-23 | Disposition: A | Discharge status: Home / Self Care | Payer: BC Managed Care – PPO | Attending: Emergency Medicine | Admitting: Emergency Medicine

## 2012-06-23 ENCOUNTER — Emergency Department (HOSPITAL_COMMUNITY): Payer: BC Managed Care – PPO

## 2012-06-23 DIAGNOSIS — Y9229 Other specified public building as the place of occurrence of the external cause: Secondary | ICD-10-CM | POA: Insufficient documentation

## 2012-06-23 DIAGNOSIS — Z8739 Personal history of other diseases of the musculoskeletal system and connective tissue: Secondary | ICD-10-CM | POA: Insufficient documentation

## 2012-06-23 DIAGNOSIS — Z87442 Personal history of urinary calculi: Secondary | ICD-10-CM | POA: Insufficient documentation

## 2012-06-23 DIAGNOSIS — S8991XA Unspecified injury of right lower leg, initial encounter: Secondary | ICD-10-CM

## 2012-06-23 DIAGNOSIS — X58XXXA Exposure to other specified factors, initial encounter: Secondary | ICD-10-CM | POA: Insufficient documentation

## 2012-06-23 DIAGNOSIS — S8990XA Unspecified injury of unspecified lower leg, initial encounter: Secondary | ICD-10-CM | POA: Insufficient documentation

## 2012-06-23 DIAGNOSIS — Y939 Activity, unspecified: Secondary | ICD-10-CM | POA: Insufficient documentation

## 2012-06-23 IMAGING — CR DG KNEE COMPLETE 4+V*R*
4 series · 4 of 4 positions shown · non-contrast
Comparison: None.

CLINICAL DATA: Right knee pain.  Mechanical symptoms.  Knee
locking.

RIGHT KNEE - COMPLETE 4+ VIEW

[x knee ap right (1 of 4)]
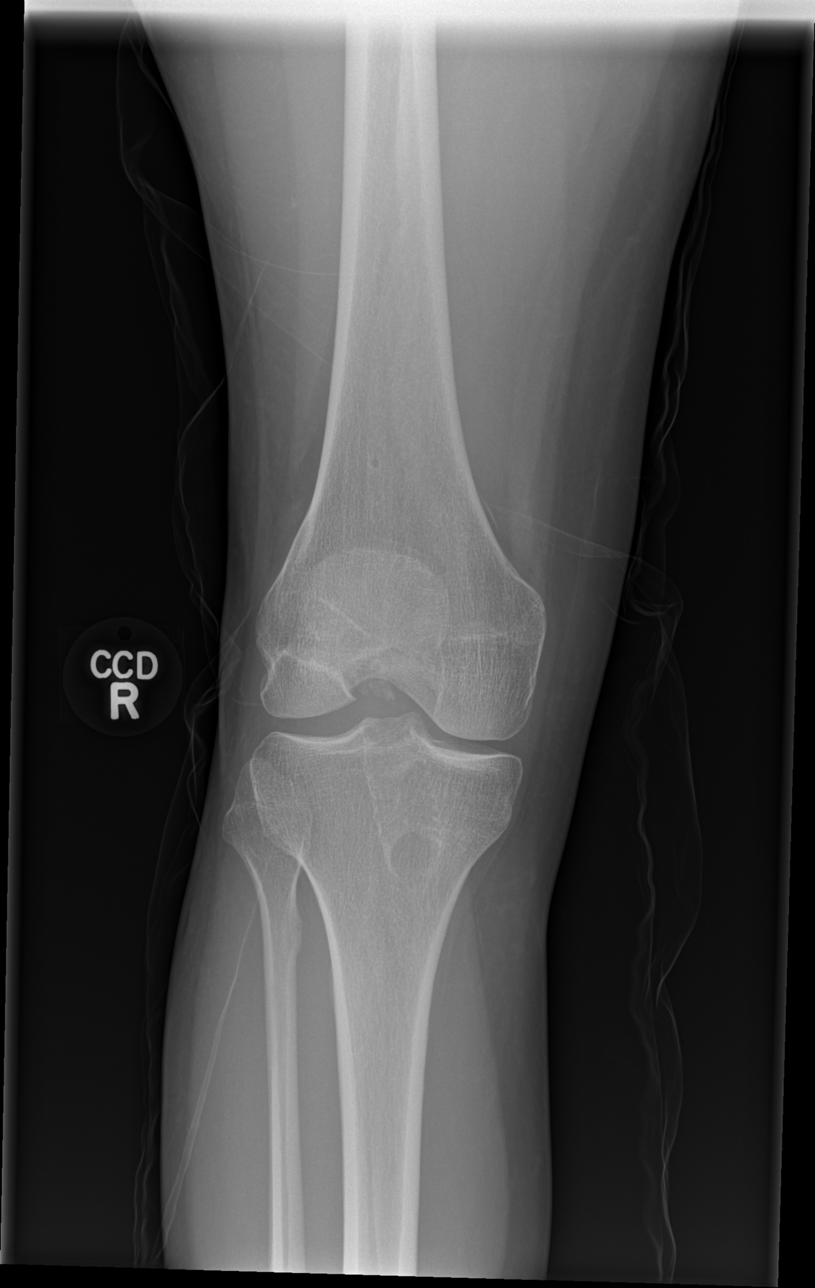

[x knee ap right (2 of 4)]
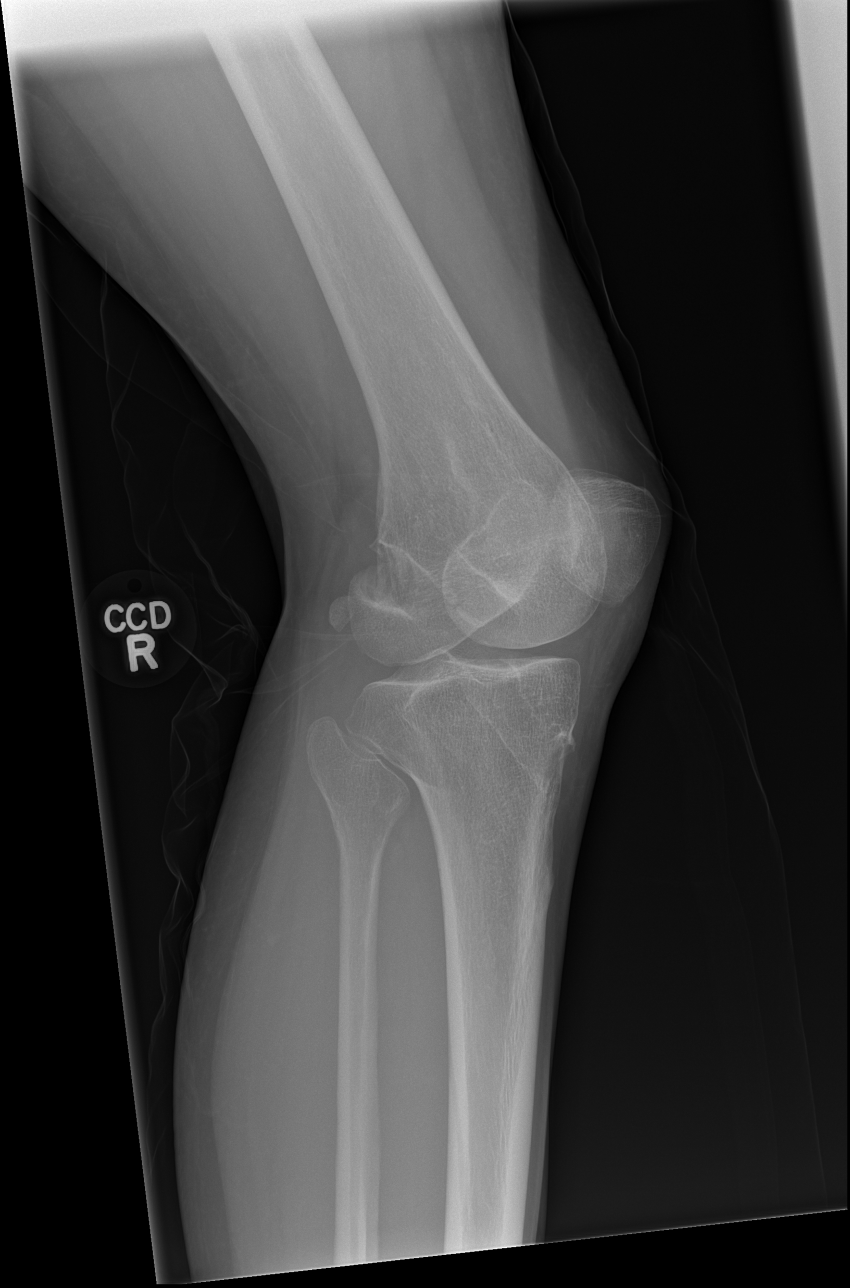

[x knee ap right (3 of 4)]
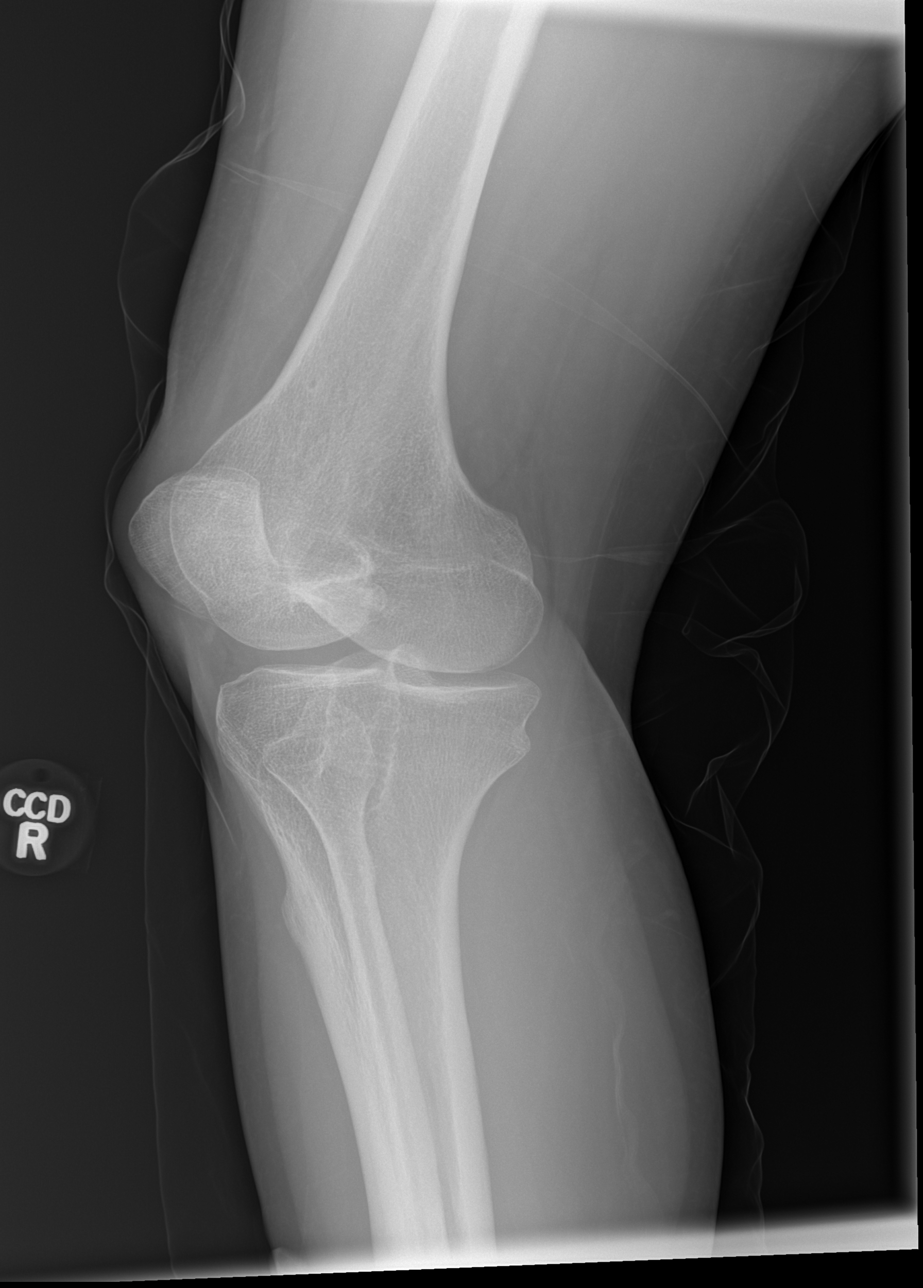

[x knee ap right (4 of 4)]
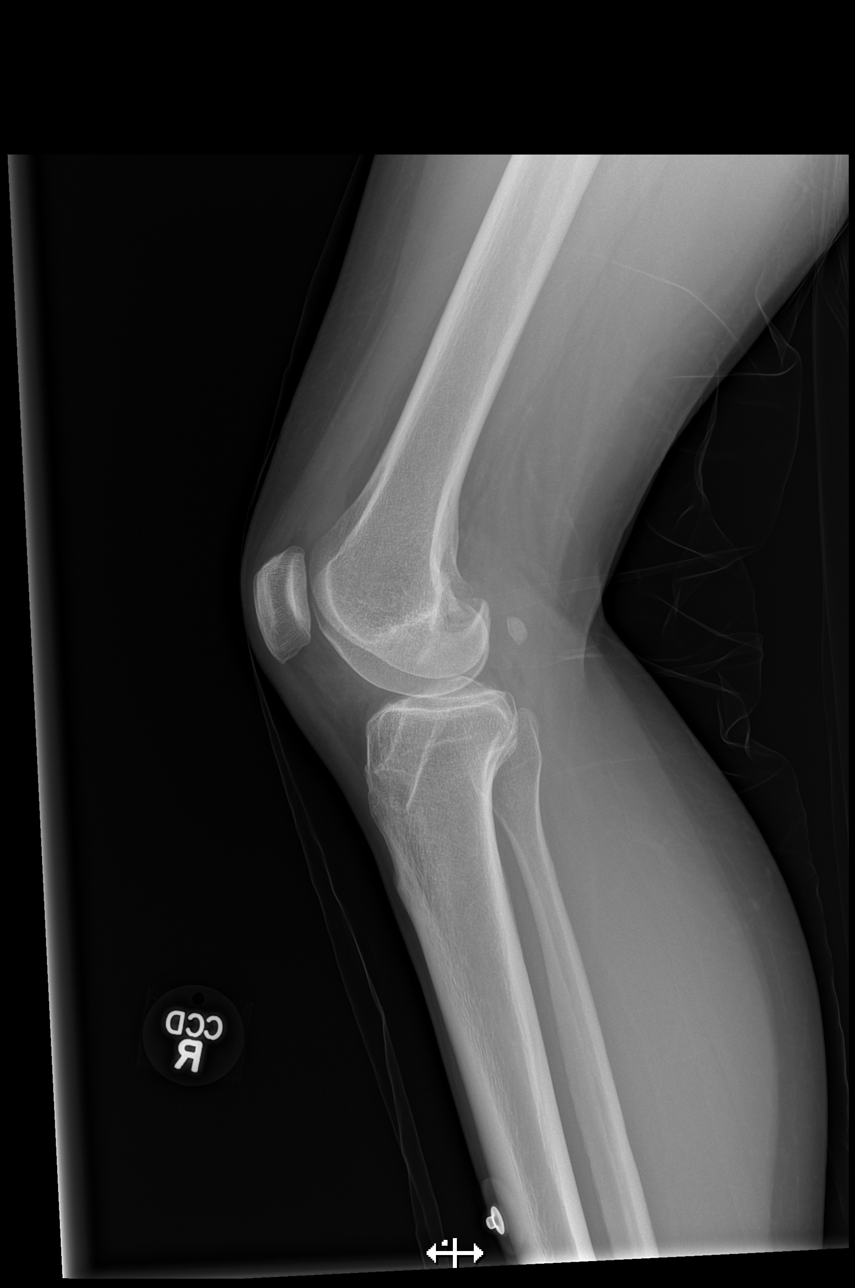

[4 of 4 positions shown; findings below may reference images not displayed]

FINDINGS: Femoral and tibial tunnels are present compatible with
ACL repair.  There is no fracture.  Mild medial compartment joint
space narrowing.  The alignment of the knee is anatomic.  Small
suprapatellar effusion.
IMPRESSION: 1.  No acute osseous abnormality.
2.  Findings compatible with prior ACL reconstruction.
3.  Mild medial compartment joint space narrowing/osteoarthritis.
4.  Small effusion.

## 2012-06-23 MED ORDER — HYDROCODONE-ACETAMINOPHEN 5-325 MG PO TABS
1.0000 | ORAL_TABLET | Freq: Once | ORAL | Status: AC
  Administered 2012-06-23: 1 via ORAL
  Filled 2012-06-23: qty 1

## 2012-06-23 MED ORDER — ONDANSETRON 4 MG PO TBDP
4.0000 mg | ORAL_TABLET | Freq: Once | ORAL | Status: AC
  Administered 2012-06-23: 4 mg via ORAL
  Filled 2012-06-23: qty 1

## 2012-06-23 MED ORDER — ONDANSETRON HCL 4 MG PO TABS: 4.0000 mg | ORAL_TABLET | Freq: Four times a day (QID) | ORAL | Status: DC

## 2012-06-23 MED ORDER — HYDROCODONE-ACETAMINOPHEN 5-325 MG PO TABS: 1.0000 | ORAL_TABLET | Freq: Three times a day (TID) | ORAL | Status: DC | PRN

## 2012-06-23 NOTE — ED Notes (Signed)
Per EMS: Pt began having right knee pain several days ago and treating with Ibuprofen and ice.  Tore right ACL 5 years ago followed by surgery.  Pt states that the rt knee locked up while at [facility] and called EMS.  Pt recently started exercising.

## 2012-06-23 NOTE — ED Provider Notes (Signed)
History     CSN: [id number]  Arrival date & time 06/23/12  1551   First MD Initiated Contact with Patient 06/23/12 1622      Chief Complaint  Patient presents with  . Knee Pain    Right- hx of right ACL surgery    (Consider location/radiation/quality/duration/timing/severity/associated sxs/prior treatment) HPI  Patient presents to the ED with complaints of right knee pain. She has a history of ACL tear back in early 2000 which was fixed by an orthopedic doctor at [facility]. She had not been having pain since then but recently increased her work out reg imine and has been hiking. Her knee has been intermittently swelling and in the last two days her knee has locked up causing significant pain while at [facility] today. She called EMS from the drug store and they brought her here. She currently has no swelling and no pain when she sits still. Denies syncope, head injury or acute injury to her knee that she can remember. nad vss   Past Medical History  Diagnosis Date  . Kidney stones     Past Surgical History  Procedure Laterality Date  . Ovarian cyst removal      right  . Knee arthroscopy w/ acl reconstruction  2009    No family history on file.  History  Substance Use Topics  . Smoking status: Never Smoker   . Smokeless tobacco: Not on file  . Alcohol Use: No    OB History   Grav Para Term Preterm Abortions TAB SAB Ect Mult Living                  Review of Systems  Review of Systems  Gen: no weight loss, fevers, chills, night sweats  Eyes: no discharge or drainage, no occular pain or visual changes  Nose: no epistaxis or rhinorrhea  Mouth: no dental pain, no sore throat  Neck: no neck pain  Lungs:No wheezing, coughing or hemoptysis CV: no chest pain, palpitations, dependent edema or orthopnea  Abd: no abdominal pain, nausea, vomiting  GU: no dysuria or gross hematuria  MSK:  + right knee pain Neuro: no headache, no focal neurologic deficits  Skin: no  abnormalities Psyche: negative.   Allergies  Review of patient's allergies indicates no known allergies.  Home Medications   Current Outpatient Rx  Name  Route  Sig  Dispense  Refill  . ibuprofen (ADVIL,MOTRIN) 800 MG tablet   Oral   Take 800 mg by mouth once.         Marland Kitchen HYDROcodone-acetaminophen (NORCO/VICODIN) 5-325 MG per tablet   Oral   Take 1 tablet by mouth every 8 (eight) hours as needed for pain.   20 tablet   0   . ondansetron (ZOFRAN) 4 MG tablet   Oral   Take 1 tablet (4 mg total) by mouth every 6 (six) hours.   12 tablet   0     BP 112/68  Pulse 77  Temp(Src) 98.7 F (37.1 C) (Oral)  Resp 16  SpO2 97%  LMP 06/20/2012  Physical Exam  Nursing note and vitals reviewed. Constitutional: She appears well-developed and well-nourished. No distress.  HENT:  Head: Normocephalic and atraumatic.  Eyes: Pupils are equal, round, and reactive to light.  Neck: Normal range of motion. Neck supple.  Cardiovascular: Normal rate and regular rhythm.   Pulmonary/Chest: Effort normal.  Abdominal: Soft.  Musculoskeletal:       Right knee: She exhibits decreased range of motion. She  exhibits no swelling, no effusion, no ecchymosis, no deformity, no laceration, no erythema, normal alignment and no LCL laxity. Tenderness found. Lateral joint line tenderness noted.  Neurological: She is alert.  Skin: Skin is warm and dry.    ED Course  Procedures (including critical care time)  Labs Reviewed - No data to display Dg Knee Complete 4 Views Right  06/23/2012  *RADIOLOGY REPORT*  Clinical Data: Right knee pain.  Mechanical symptoms.  Knee locking.  RIGHT KNEE - COMPLETE 4+ VIEW  Comparison: None.  Findings: Femoral and tibial tunnels are present compatible with ACL repair.  There is no fracture.  Mild medial compartment joint space narrowing.  The alignment of the knee is anatomic.  Small suprapatellar effusion.  IMPRESSION:  1.  No acute osseous abnormality. 2.  Findings  compatible with prior ACL reconstruction. 3.  Mild medial compartment joint space narrowing/osteoarthritis. 4.  Small effusion.   Original Report Authenticated By: Andreas Newport, M.D.      1. Right knee injury, initial encounter       MDM  Most likely repeat ligament injury. Will need an MRI. Placed in knee immoblizer and crutches. Given pain medication and ortho referral. Instructed to RICE Pt has been advised of the symptoms that warrant their return to the ED. Patient has voiced understanding and has agreed to follow-up with the PCP or specialist.         Dorthula Matas, PA-C 06/23/12 1841

## 2012-06-23 NOTE — ED Notes (Signed)
Lauren, EMT removed PIV that was placed by EMS en route.

## 2012-06-24 NOTE — ED Provider Notes (Signed)
Medical screening examination/treatment/procedure(s) were performed by non-physician practitioner and as supervising physician I was immediately available for consultation/collaboration.  Telicia Hodgkiss, MD 06/24/12 0131 

## 2014-03-25 ENCOUNTER — Other Ambulatory Visit: Payer: Self-pay | Admitting: Family Medicine

## 2014-03-25 ENCOUNTER — Ambulatory Visit
Admission: RE | Admit: 2014-03-25 | Discharge: 2014-03-25 | Disposition: A | Discharge status: Home / Self Care | Payer: BC Managed Care – PPO | Source: Ambulatory Visit | Attending: Family Medicine | Admitting: Family Medicine

## 2014-03-25 DIAGNOSIS — R109 Unspecified abdominal pain: Secondary | ICD-10-CM

## 2014-03-25 IMAGING — CT CT ABD-PELV W/O CM
3 of 4 series · 8 of 46 positions shown, 15 images · non-contrast
Comparison: CT urogram of [DATE]

CLINICAL DATA: Left lower abdomen pain for 3 days, nausea a history
of kidney stones and prior lithotripsy

EXAM:
CT ABDOMEN AND PELVIS WITHOUT CONTRAST
TECHNIQUE: Multidetector CT imaging of the abdomen and pelvis was performed
following the standard protocol without IV contrast.

[Series 3: lung windows · axial · 0.64mm/px · z∈[-76,-21]mm · 4 of 19 slices shown, 9 images]
[im 4/19  soft-tissue]
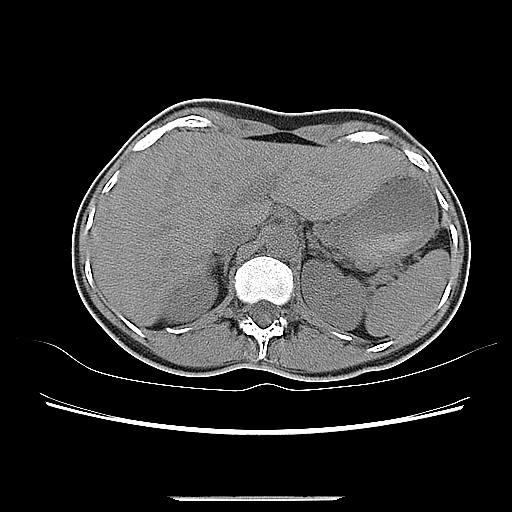
[im 4/19  lung]
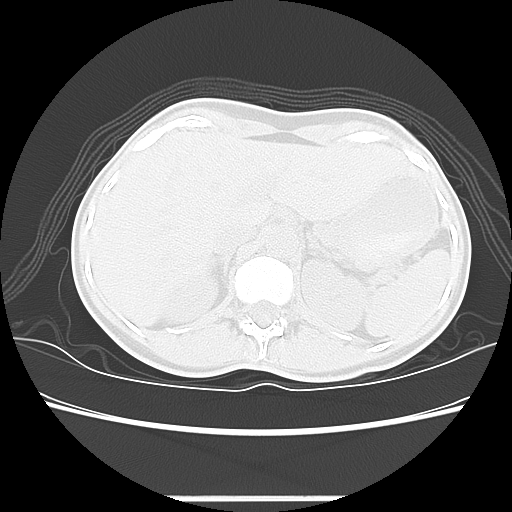
[im 4/19  bone]
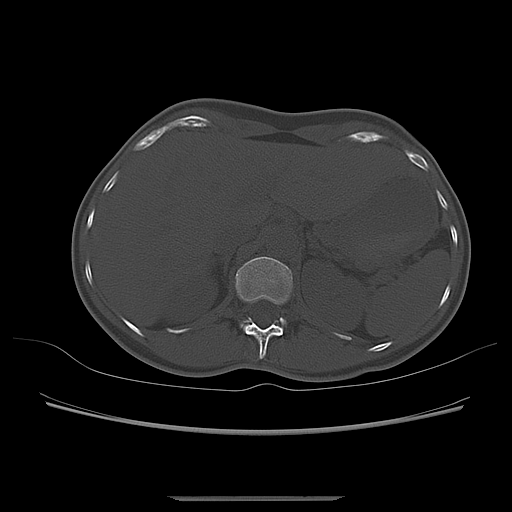
[im 8/19  soft-tissue]
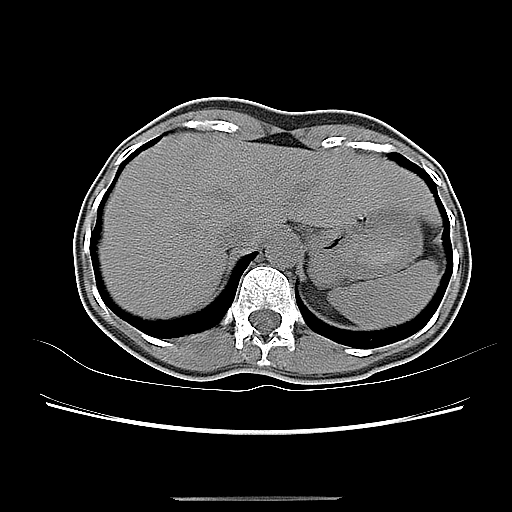
[im 8/19  lung]
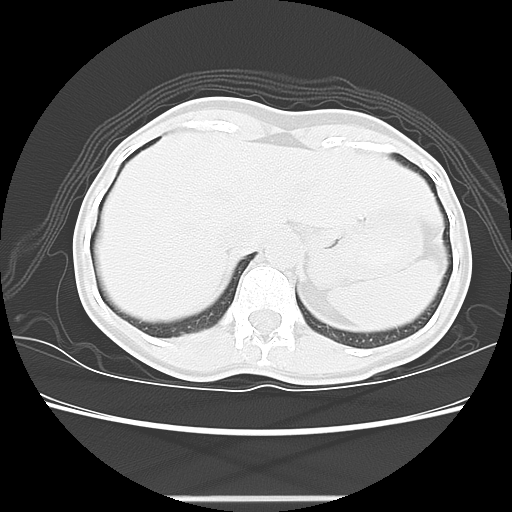
[im 11/19  soft-tissue]
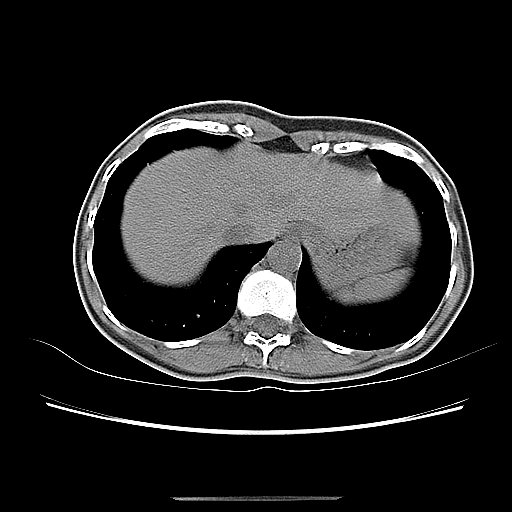
[im 11/19  lung]
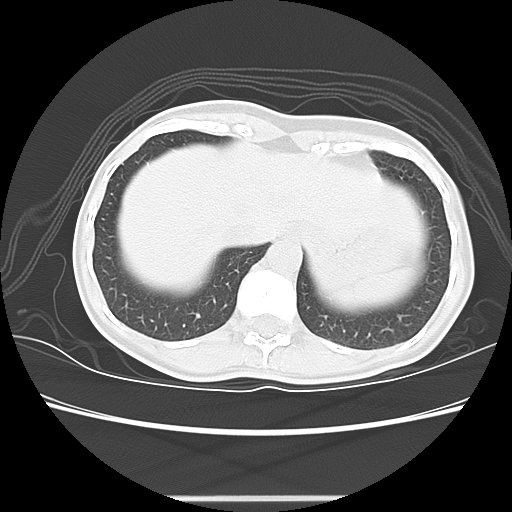
[im 15/19  soft-tissue]
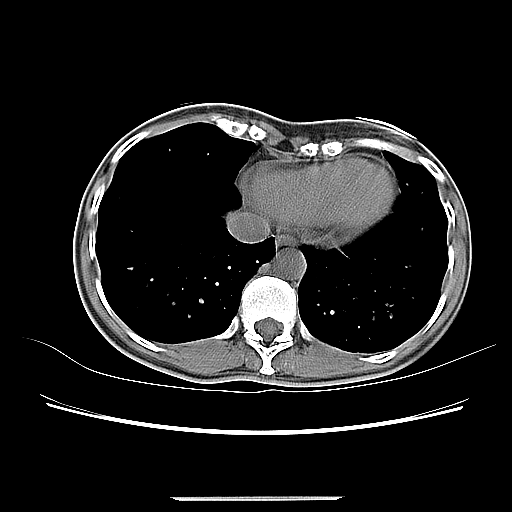
[im 15/19  lung]
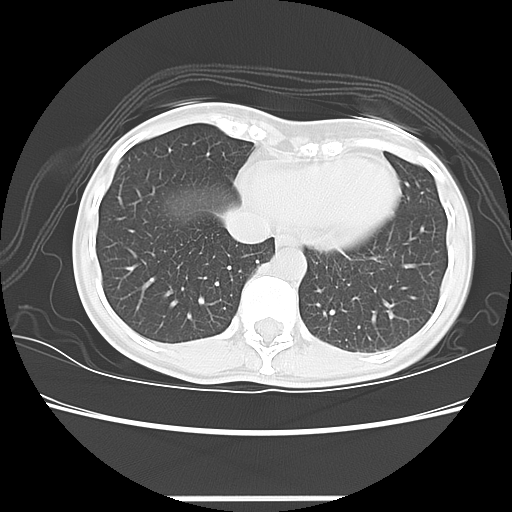

[Series 400: cor · coronal · 0.85mm/px · 3 of 104 slices shown, 4 images]
[im 35/104  soft-tissue]
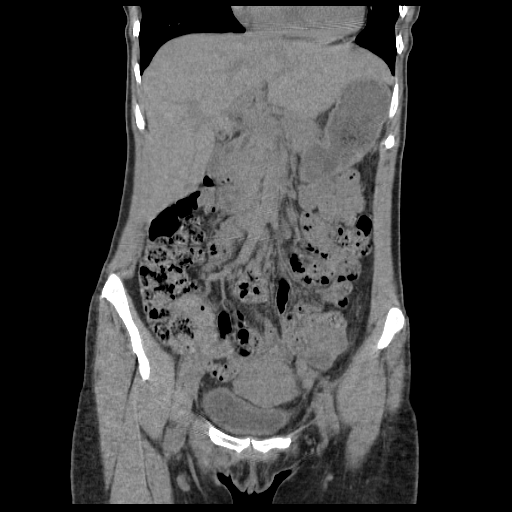
[im 46/104  soft-tissue]
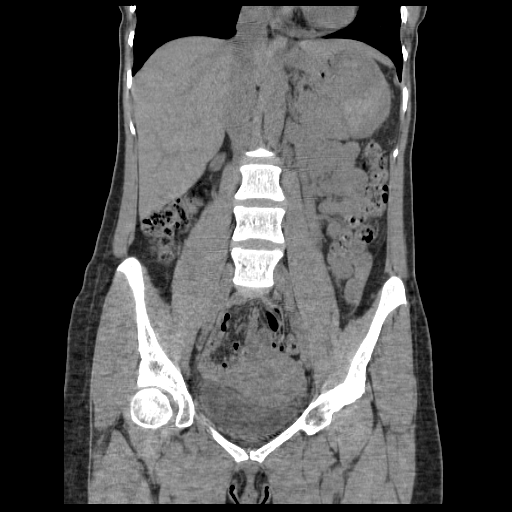
[im 46/104  bone]
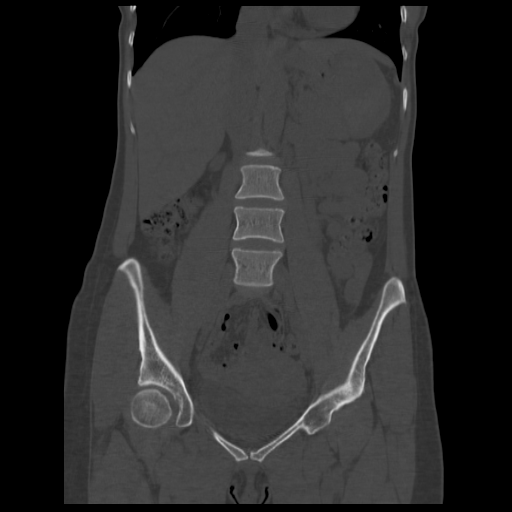
[im 58/104  soft-tissue]
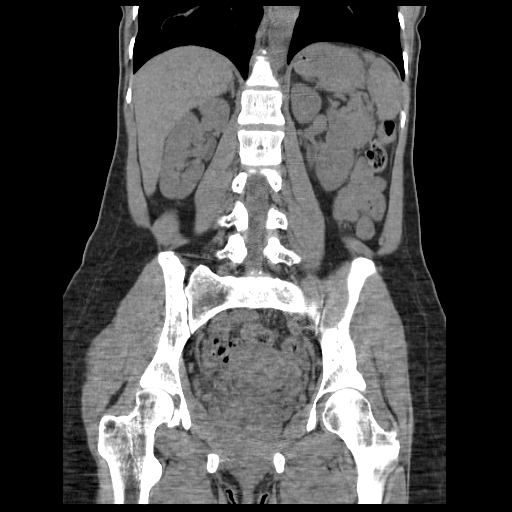

[Series 401: sag · sagittal · 0.85mm/px · 1 of 163 slices shown, 2 images]
[im 55/163  soft-tissue]
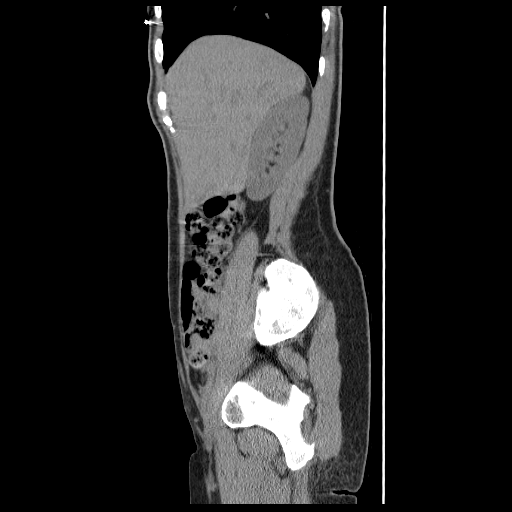
[im 55/163  bone]
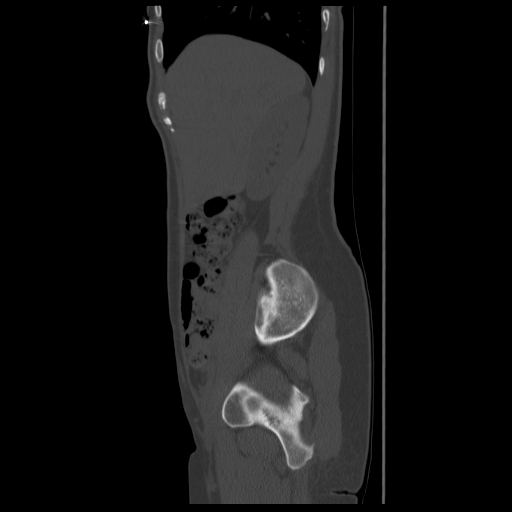

[8 of 46 positions shown; findings below may reference images not displayed]

FINDINGS: The lung bases are clear. The liver is unremarkable in the
unenhanced state. The gallbladder is somewhat contracted but no
calcified gallstones are seen. The pancreas is difficult to assess,
but no pancreatic ductal dilatation is seen and the peripancreatic
fat planes appear well preserved. The adrenal glands and spleen are
unremarkable. The stomach is moderately distended with food debris
and fluid with no gross abnormality evident. No right renal calculi
are seen. There is a single 4 mm calculus which is nonobstructing in
the lower pole of the left kidney. An adjacent rounded
low-attenuation structure is most consistent with a left renal cyst
which appears to have increased in size since the CT from [JJ], now
measuring 2.8 x 2.1 cm compared to previous measurements of 1.2 cm.
There is no evidence of hydronephrosis. The abdominal aorta is
normal in caliber. No adenopathy is seen.

The distal ureters are not well seen, but no dilatation of the
ureter is noted and there is no evidence of distal ureteral
calculus. The urinary bladder is somewhat decompressed. The uterus
is positioned anteriorly and there is low-attenuation centrally
within the uterus most likely related to the patient's menstrual
cycle. Left ovarian follicles are present and there is a small
amount of free fluid in the pelvis possibly due to a recently
ruptured follicle. There is a moderate amount of feces throughout
the colon. No significant abnormality of the colon is noted. The
terminal ileum is unremarkable. The appendix is retrocecal in
position and unremarkable as well. No inflammatory process is seen.
The lumbar vertebrae are in normal alignment with normal disc
spaces. The SI joints are corticated.
IMPRESSION: 1. Single nonobstructing 4 mm calculus in the lower pole of the left
kidney. No hydronephrosis.
2. No ureteral calculi are noted.
3. Left ovarian follicles with small amount of free fluid in the
pelvis probably due to a recently ruptured ovarian cyst.
4. The appendix and terminal ileum are unremarkable.

## 2017-12-20 ENCOUNTER — Ambulatory Visit: Payer: BC Managed Care – PPO | Admitting: Family Medicine

## 2018-03-10 ENCOUNTER — Ambulatory Visit: Payer: Self-pay

## 2018-03-10 ENCOUNTER — Other Ambulatory Visit: Payer: Self-pay | Admitting: Family Medicine

## 2018-03-10 DIAGNOSIS — M25571 Pain in right ankle and joints of right foot: Secondary | ICD-10-CM

## 2018-03-10 DIAGNOSIS — M79671 Pain in right foot: Secondary | ICD-10-CM

## 2018-03-10 IMAGING — DX DG FOOT COMPLETE 3+V*R*
3 series · 3 of 3 positions shown · non-contrast
Comparison: No recent.

CLINICAL DATA: Fall.  Right foot pain.

EXAM:
RIGHT FOOT COMPLETE - 3+ VIEW

[foot ap]
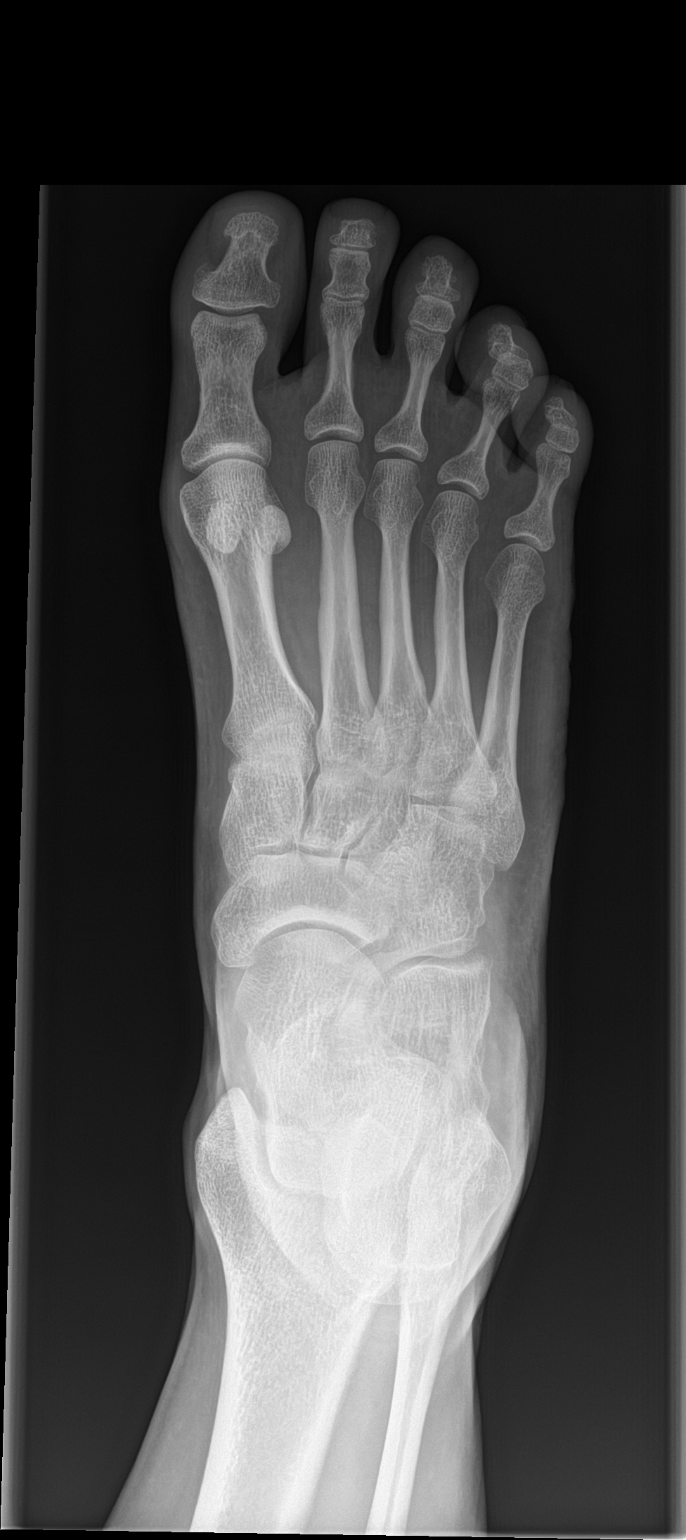

[foot obl]
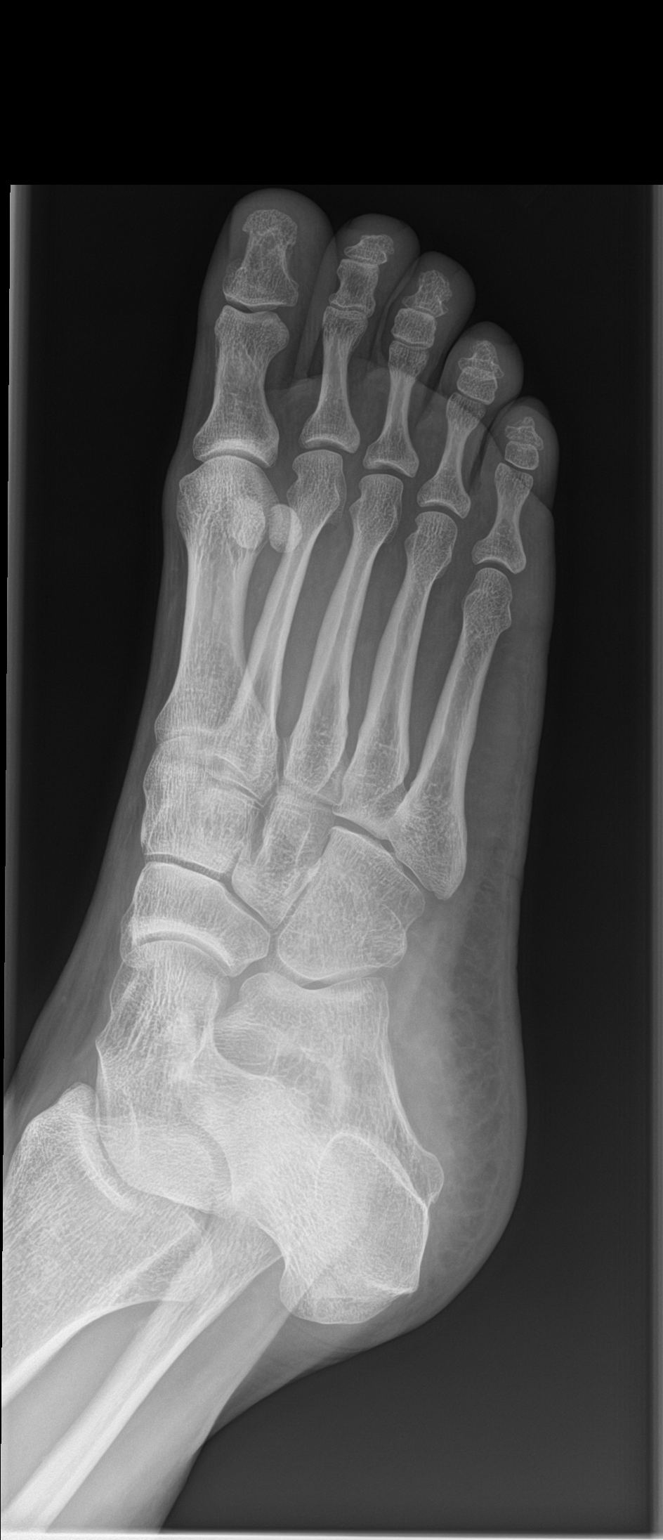

[foot lat]
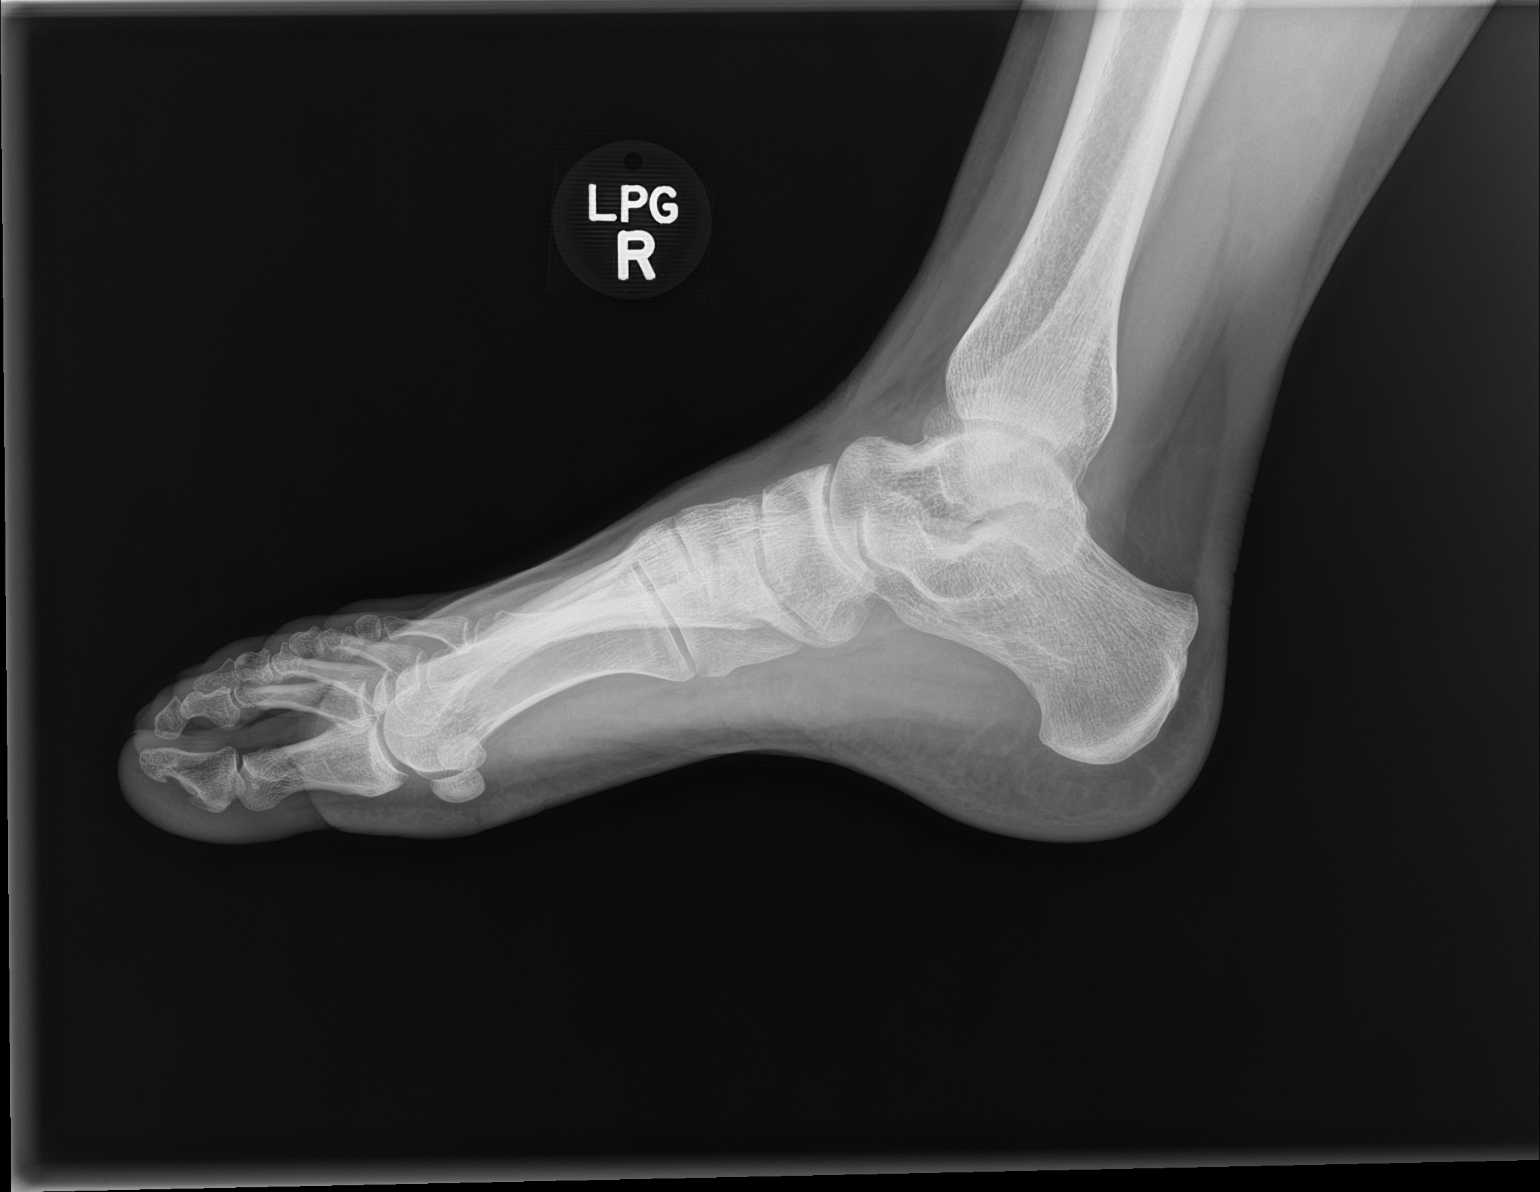

[3 of 3 positions shown; findings below may reference images not displayed]

FINDINGS: No acute bony or joint abnormality identified. No evidence of
fracture or dislocation. Soft tissue structures are unremarkable.
IMPRESSION: No acute abnormality.

## 2018-03-10 IMAGING — DX DG ANKLE COMPLETE 3+V*R*
3 series · 3 of 3 positions shown · non-contrast
Comparison: None.

CLINICAL DATA: Acute right ankle pain after fall.

EXAM:
RIGHT ANKLE - COMPLETE 3+ VIEW

[ankle ap]
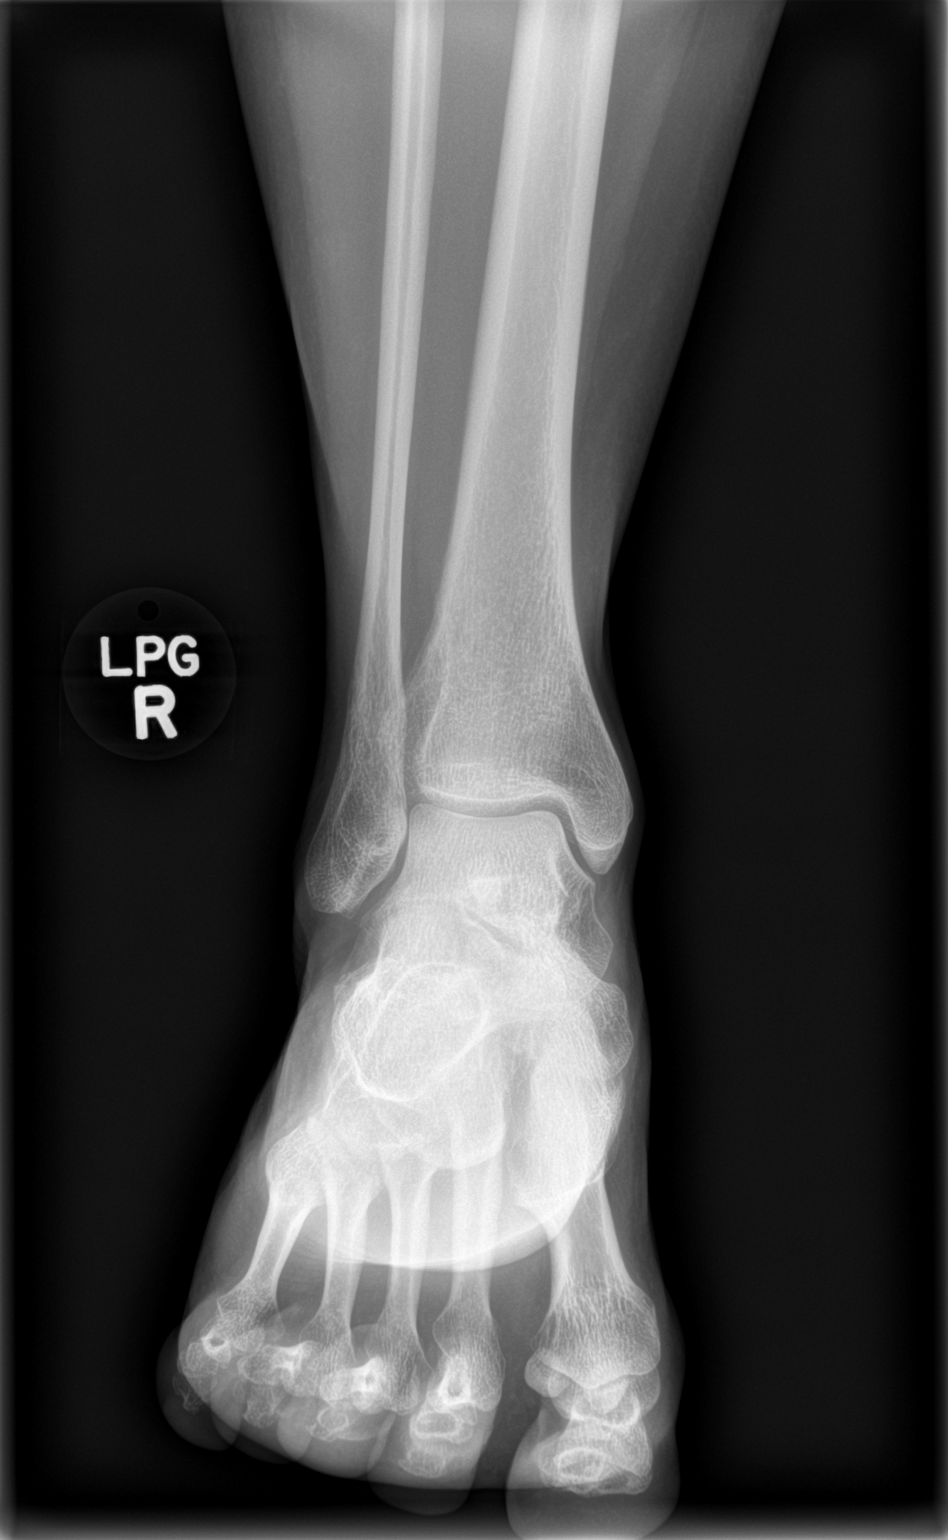

[ankle mortise]
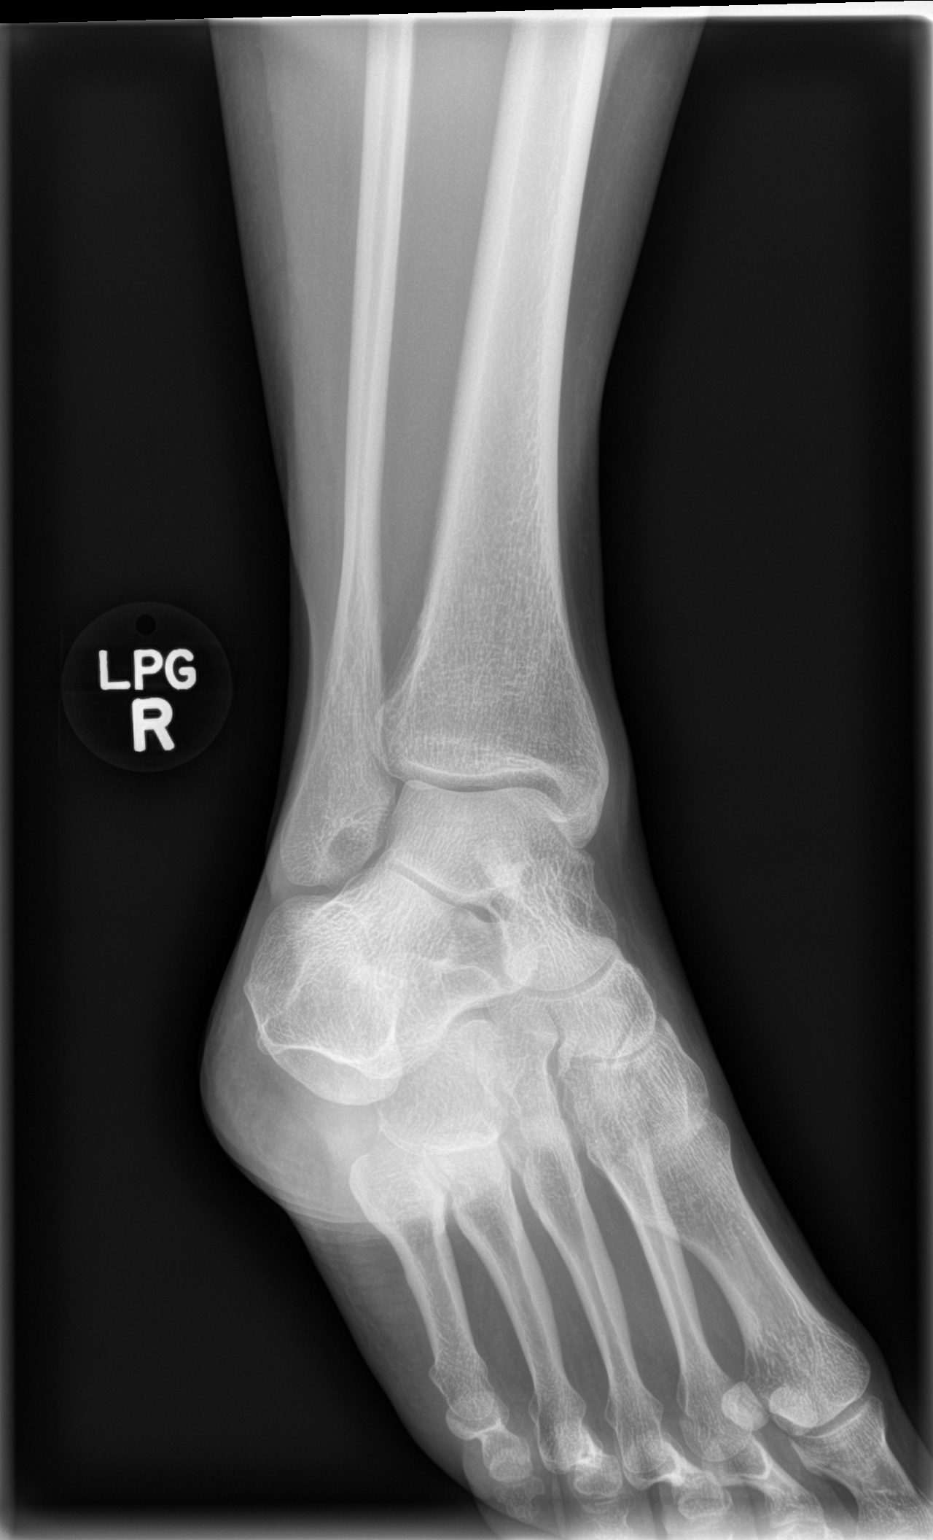

[ankle lat]
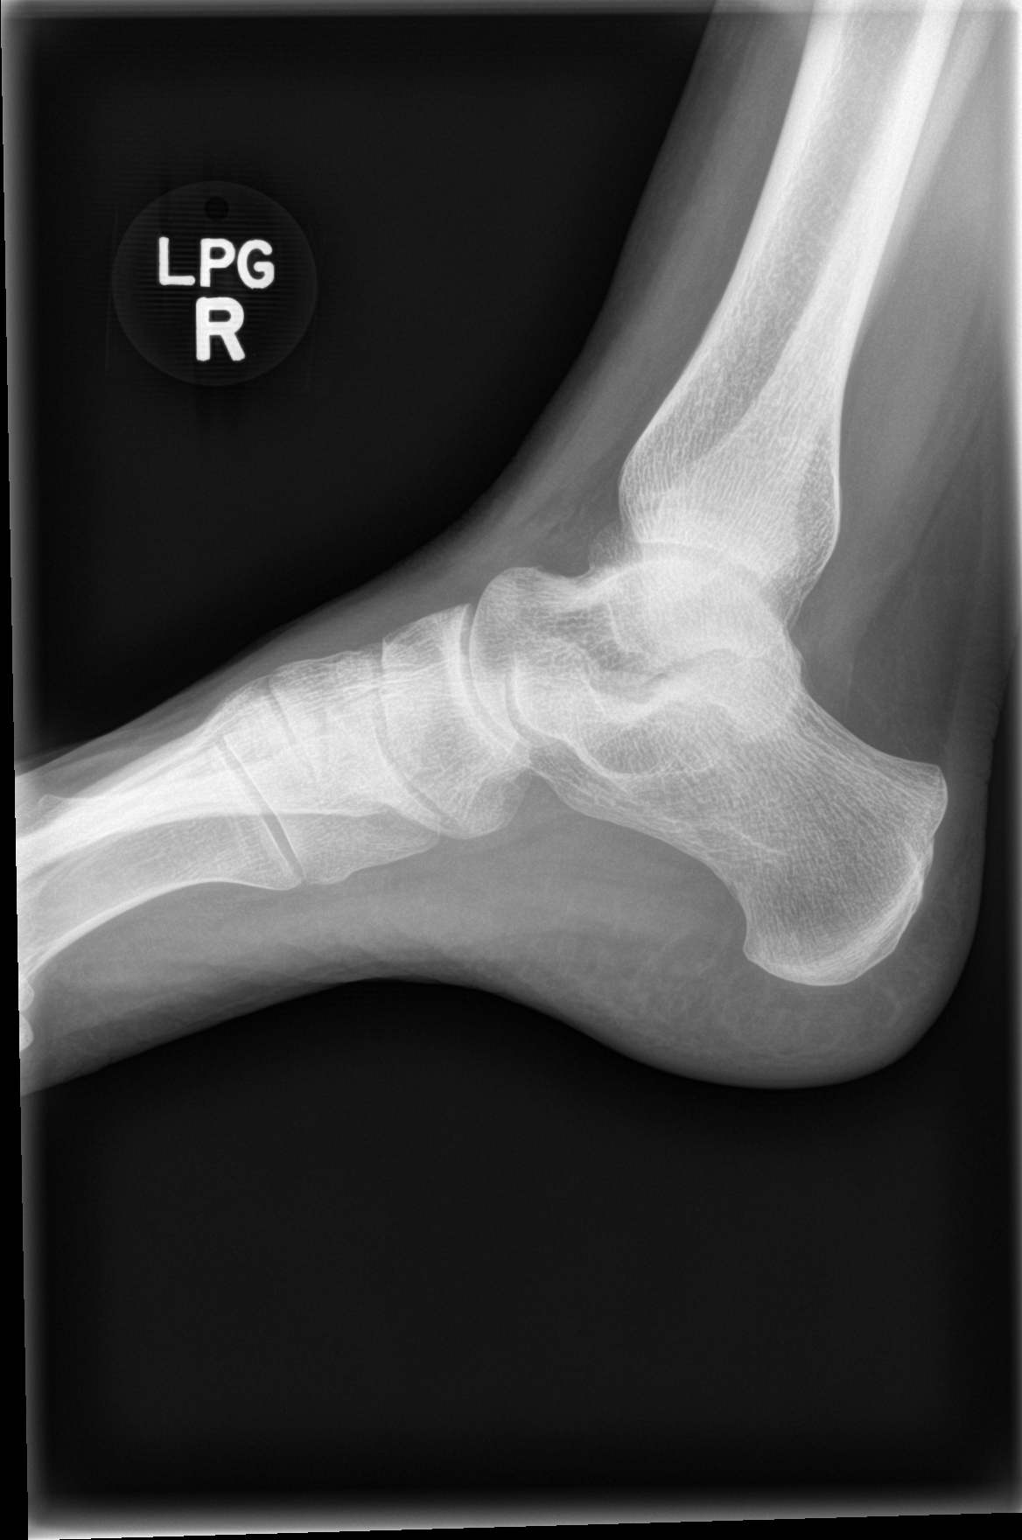

[3 of 3 positions shown; findings below may reference images not displayed]

FINDINGS: There is no evidence of fracture, dislocation, or joint effusion.
There is no evidence of arthropathy or other focal bone abnormality.
Soft tissues are unremarkable.
IMPRESSION: Negative.

## 2018-09-18 ENCOUNTER — Encounter: Payer: Self-pay | Admitting: Podiatry

## 2018-09-18 ENCOUNTER — Other Ambulatory Visit: Payer: Self-pay

## 2018-09-18 ENCOUNTER — Ambulatory Visit (INDEPENDENT_AMBULATORY_CARE_PROVIDER_SITE_OTHER): Payer: Managed Care, Other (non HMO) | Admitting: Podiatry

## 2018-09-18 VITALS — BP 109/71 | HR 70 | Temp 98.1°F | Resp 16

## 2018-09-18 DIAGNOSIS — B070 Plantar wart: Secondary | ICD-10-CM | POA: Diagnosis not present

## 2018-09-18 NOTE — Patient Instructions (Signed)

## 2018-09-18 NOTE — Progress Notes (Signed)
  Subjective:  Patient ID: Kristina Hahn, female    DOB: 1971-01-20,  MRN: [id number] HPI Chief Complaint  Patient presents with  . Foot Pain    Plantar heel right - callused lesion x 2 months, tried OTC meds and trimming, want removed  . New Patient (Initial Visit)    48 y.o. female presents with the above complaint.   ROS: Denies fever chills nausea vomiting muscle aches pains calf pain back pain chest pain shortness of breath.  Past Medical History:  Diagnosis Date  . Kidney stones    Past Surgical History:  Procedure Laterality Date  . KNEE ARTHROSCOPY W/ ACL RECONSTRUCTION  2009  . OVARIAN CYST REMOVAL     right   No current outpatient medications on file.  Allergies  Allergen Reactions  . Aspirin   . Sulfa Antibiotics    Review of Systems Objective:   Vitals:   09/18/18 1018  BP: 109/71  Pulse: 70  Resp: 16  Temp: 98.1 F (36.7 C)    General: Well developed, nourished, in no acute distress, alert and oriented x3   Dermatological: Skin is warm, dry and supple bilateral. Nails x 10 are well maintained; remaining integument appears unremarkable at this time. There are no open sores, no preulcerative lesions, no rash or signs of infection present.  Small verrucoid lesion appears to be solitary the posterior inferior aspect of the right heel.  Measures less than a centimeter diameter.  Thrombosed capillaries are visible.  Skin lines circumvent the lesion.  Vascular: Dorsalis Pedis artery and Posterior Tibial artery pedal pulses are 2/4 bilateral with immedate capillary fill time. Pedal hair growth present. No varicosities and no lower extremity edema present bilateral.   Neruologic: Grossly intact via light touch bilateral. Vibratory intact via tuning fork bilateral. Protective threshold with Semmes Wienstein monofilament intact to all pedal sites bilateral. Patellar and Achilles deep tendon reflexes 2+ bilateral. No Babinski or clonus noted bilateral.    Musculoskeletal: No gross boney pedal deformities bilateral. No pain, crepitus, or limitation noted with foot and ankle range of motion bilateral. Muscular strength 5/5 in all groups tested bilateral.  Gait: Unassisted, Nonantalgic.    Radiographs:  None taken  Assessment & Plan:   Assessment: Verrucoid lesion less than 1 cm in diameter plantar posterior right heel  Plan: Discussed etiology pathology conservative versus surgical therapies at this point performed a surgical curettage after local anesthetic was administered.  She tolerated procedure well without complications.  I will like to follow-up with her in a couple weeks to make sure she is doing well.     Kellyann Ordway T. Pratt, [place]

## 2018-09-29 ENCOUNTER — Telehealth: Payer: Self-pay | Admitting: *Deleted

## 2018-09-29 NOTE — Telephone Encounter (Signed)
-----   Message from Garrel Ridgel, [place] sent at 09/29/2018 12:08 PM EDT ----- wart

## 2018-09-29 NOTE — Telephone Encounter (Signed)
Left message requesting pt call for results. 

## 2018-10-02 ENCOUNTER — Ambulatory Visit: Payer: Managed Care, Other (non HMO) | Admitting: Podiatry

## 2019-03-02 DIAGNOSIS — M9903 Segmental and somatic dysfunction of lumbar region: Secondary | ICD-10-CM | POA: Diagnosis not present

## 2019-03-02 DIAGNOSIS — M9902 Segmental and somatic dysfunction of thoracic region: Secondary | ICD-10-CM | POA: Diagnosis not present

## 2019-03-02 DIAGNOSIS — M9905 Segmental and somatic dysfunction of pelvic region: Secondary | ICD-10-CM | POA: Diagnosis not present

## 2019-03-02 DIAGNOSIS — M5386 Other specified dorsopathies, lumbar region: Secondary | ICD-10-CM | POA: Diagnosis not present

## 2019-04-22 DIAGNOSIS — R079 Chest pain, unspecified: Secondary | ICD-10-CM | POA: Diagnosis not present

## 2019-04-29 DIAGNOSIS — M9903 Segmental and somatic dysfunction of lumbar region: Secondary | ICD-10-CM | POA: Diagnosis not present

## 2019-04-29 DIAGNOSIS — M5386 Other specified dorsopathies, lumbar region: Secondary | ICD-10-CM | POA: Diagnosis not present

## 2019-04-29 DIAGNOSIS — M9905 Segmental and somatic dysfunction of pelvic region: Secondary | ICD-10-CM | POA: Diagnosis not present

## 2019-04-29 DIAGNOSIS — M9902 Segmental and somatic dysfunction of thoracic region: Secondary | ICD-10-CM | POA: Diagnosis not present

## 2019-05-05 DIAGNOSIS — M9903 Segmental and somatic dysfunction of lumbar region: Secondary | ICD-10-CM | POA: Diagnosis not present

## 2019-05-05 DIAGNOSIS — M9905 Segmental and somatic dysfunction of pelvic region: Secondary | ICD-10-CM | POA: Diagnosis not present

## 2019-05-05 DIAGNOSIS — L814 Other melanin hyperpigmentation: Secondary | ICD-10-CM | POA: Diagnosis not present

## 2019-05-05 DIAGNOSIS — L821 Other seborrheic keratosis: Secondary | ICD-10-CM | POA: Diagnosis not present

## 2019-05-05 DIAGNOSIS — M5386 Other specified dorsopathies, lumbar region: Secondary | ICD-10-CM | POA: Diagnosis not present

## 2019-05-05 DIAGNOSIS — M9902 Segmental and somatic dysfunction of thoracic region: Secondary | ICD-10-CM | POA: Diagnosis not present

## 2019-05-05 DIAGNOSIS — Z86018 Personal history of other benign neoplasm: Secondary | ICD-10-CM | POA: Diagnosis not present

## 2019-05-05 DIAGNOSIS — D225 Melanocytic nevi of trunk: Secondary | ICD-10-CM | POA: Diagnosis not present

## 2019-05-12 DIAGNOSIS — Z23 Encounter for immunization: Secondary | ICD-10-CM | POA: Diagnosis not present

## 2019-06-09 DIAGNOSIS — Z23 Encounter for immunization: Secondary | ICD-10-CM | POA: Diagnosis not present

## 2019-07-21 DIAGNOSIS — M5386 Other specified dorsopathies, lumbar region: Secondary | ICD-10-CM | POA: Diagnosis not present

## 2019-07-21 DIAGNOSIS — M9902 Segmental and somatic dysfunction of thoracic region: Secondary | ICD-10-CM | POA: Diagnosis not present

## 2019-07-21 DIAGNOSIS — M9903 Segmental and somatic dysfunction of lumbar region: Secondary | ICD-10-CM | POA: Diagnosis not present

## 2019-07-21 DIAGNOSIS — M9905 Segmental and somatic dysfunction of pelvic region: Secondary | ICD-10-CM | POA: Diagnosis not present

## 2019-08-20 DIAGNOSIS — M9905 Segmental and somatic dysfunction of pelvic region: Secondary | ICD-10-CM | POA: Diagnosis not present

## 2019-08-20 DIAGNOSIS — M9903 Segmental and somatic dysfunction of lumbar region: Secondary | ICD-10-CM | POA: Diagnosis not present

## 2019-08-20 DIAGNOSIS — M9902 Segmental and somatic dysfunction of thoracic region: Secondary | ICD-10-CM | POA: Diagnosis not present

## 2019-08-20 DIAGNOSIS — M5386 Other specified dorsopathies, lumbar region: Secondary | ICD-10-CM | POA: Diagnosis not present

## 2019-08-26 DIAGNOSIS — M7581 Other shoulder lesions, right shoulder: Secondary | ICD-10-CM | POA: Diagnosis not present

## 2019-08-26 DIAGNOSIS — M9903 Segmental and somatic dysfunction of lumbar region: Secondary | ICD-10-CM | POA: Diagnosis not present

## 2019-08-26 DIAGNOSIS — M5386 Other specified dorsopathies, lumbar region: Secondary | ICD-10-CM | POA: Diagnosis not present

## 2019-08-26 DIAGNOSIS — M5412 Radiculopathy, cervical region: Secondary | ICD-10-CM | POA: Diagnosis not present

## 2019-08-26 DIAGNOSIS — M9902 Segmental and somatic dysfunction of thoracic region: Secondary | ICD-10-CM | POA: Diagnosis not present

## 2019-08-26 DIAGNOSIS — M9905 Segmental and somatic dysfunction of pelvic region: Secondary | ICD-10-CM | POA: Diagnosis not present

## 2019-09-01 ENCOUNTER — Other Ambulatory Visit: Payer: Self-pay | Admitting: Family Medicine

## 2019-09-01 DIAGNOSIS — M542 Cervicalgia: Secondary | ICD-10-CM | POA: Diagnosis not present

## 2019-09-01 DIAGNOSIS — M67911 Unspecified disorder of synovium and tendon, right shoulder: Secondary | ICD-10-CM | POA: Diagnosis not present

## 2019-09-01 DIAGNOSIS — M75101 Unspecified rotator cuff tear or rupture of right shoulder, not specified as traumatic: Secondary | ICD-10-CM

## 2019-09-03 ENCOUNTER — Other Ambulatory Visit: Payer: Self-pay

## 2019-09-03 ENCOUNTER — Ambulatory Visit
Admission: RE | Admit: 2019-09-03 | Discharge: 2019-09-03 | Disposition: A | Discharge status: Home / Self Care | Payer: BLUE CROSS/BLUE SHIELD | Source: Ambulatory Visit | Attending: Family Medicine | Admitting: Family Medicine

## 2019-09-03 DIAGNOSIS — M75101 Unspecified rotator cuff tear or rupture of right shoulder, not specified as traumatic: Secondary | ICD-10-CM

## 2019-09-03 DIAGNOSIS — M25511 Pain in right shoulder: Secondary | ICD-10-CM | POA: Diagnosis not present

## 2019-09-03 IMAGING — MR MR SHOULDER*R* W/O CM
4 of 5 series · 22 of 40 positions shown · non-contrast
Comparison: None.

CLINICAL DATA: Right shoulder pain, right arm tingling in the
fingers.

EXAM:
MRI OF THE RIGHT SHOULDER WITHOUT CONTRAST
TECHNIQUE: Multiplanar, multisequence MR imaging of the shoulder was performed.
No intravenous contrast was administered.

[Series 6: PD fat-sat · axial · right · 4.0mm · 0.44mm/px · z∈[-37,+54]mm · 6 of 20 slices shown (1 of 2)]
[im 1/20]
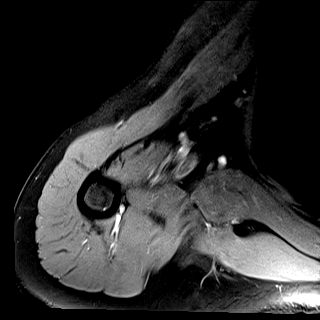
[im 4/20]
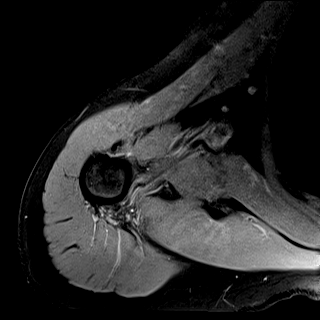
[im 8/20]
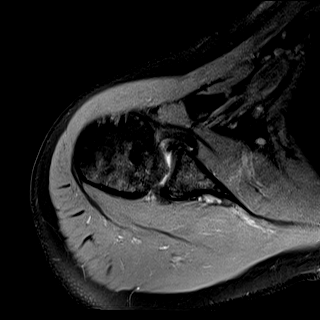
[im 12/20]
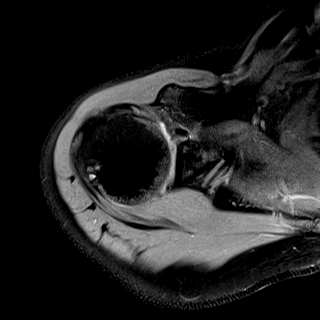
[im 16/20]
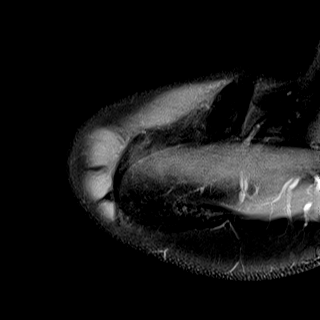
[im 20/20]
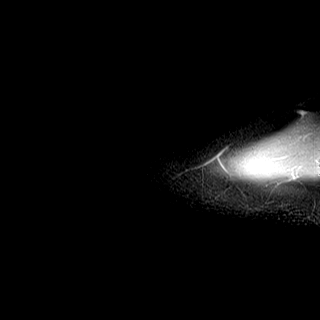

[Series 7: T2 fat-sat · oblique · right · 4.0mm · 0.22mm/px · 5 of 21 slices shown (1 of 2)]
[im 1/21]
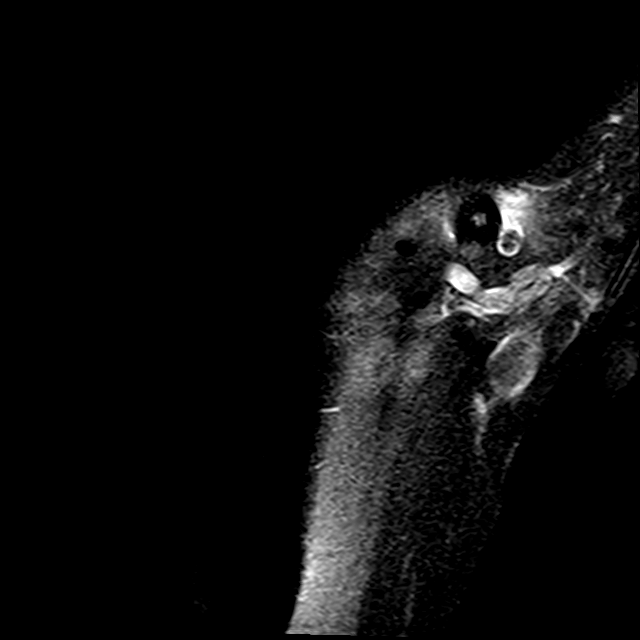
[im 3/21]
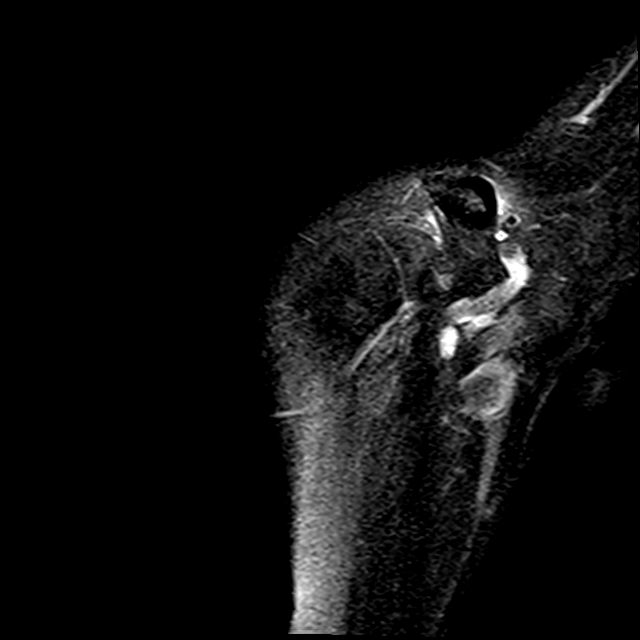
[im 6/21]
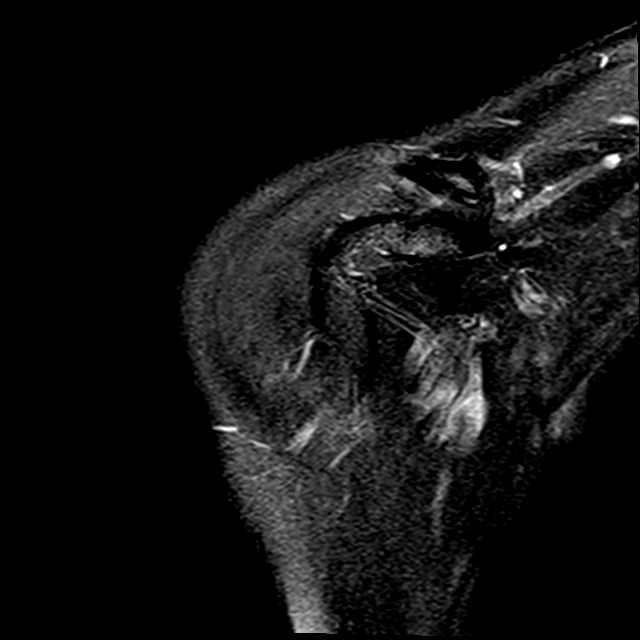
[im 12/21]
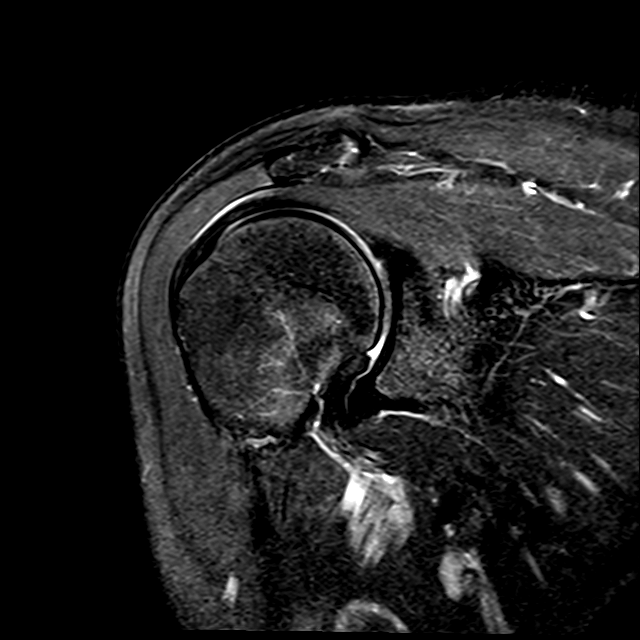
[im 18/21]
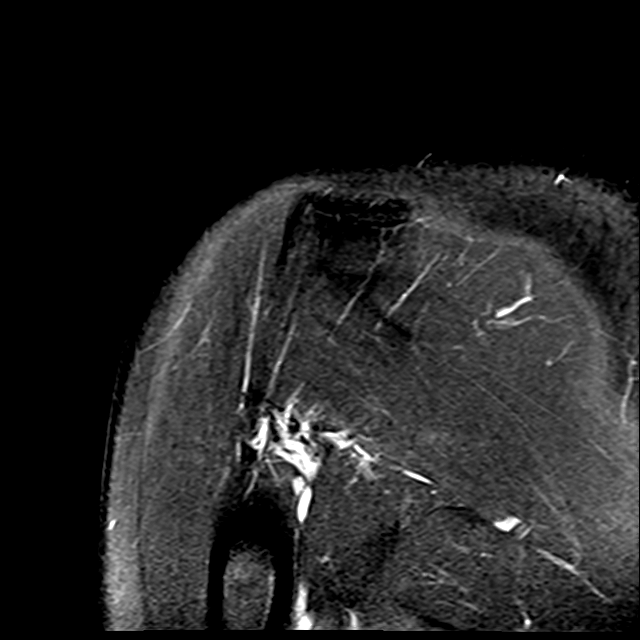

[Series 8: PD fat-sat · oblique · right · 4.0mm · 0.27mm/px · 8 of 21 slices shown (2 of 2)]
[im 1/21]
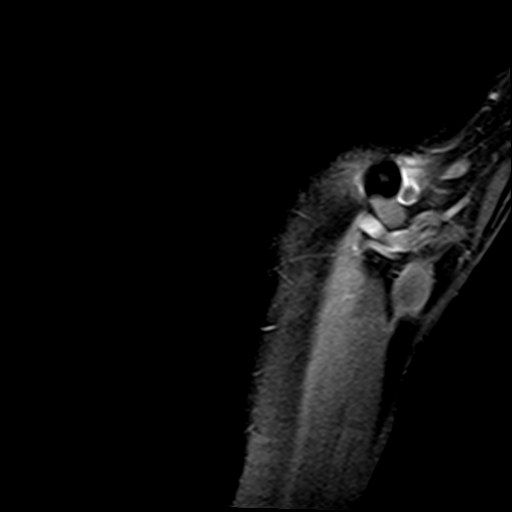
[im 3/21]
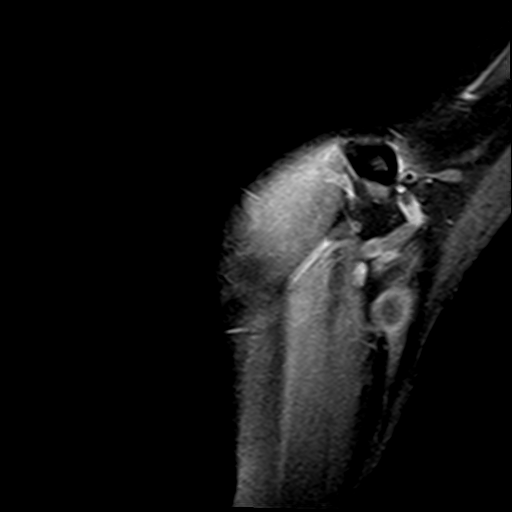
[im 6/21]
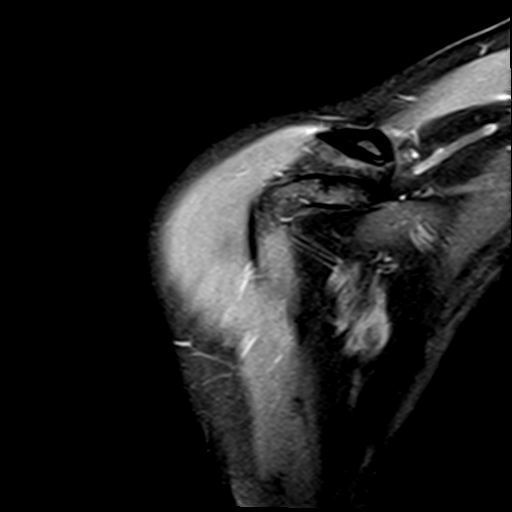
[im 9/21]
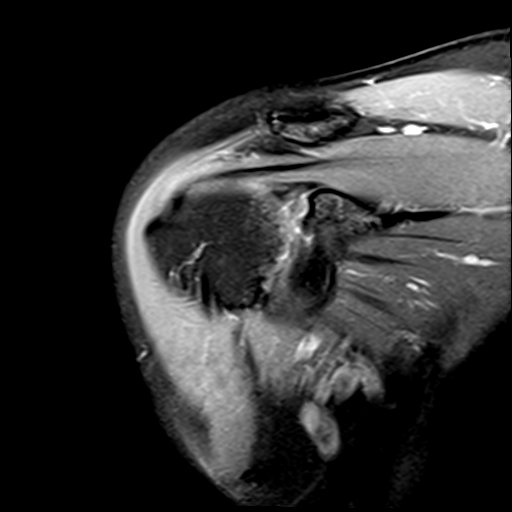
[im 12/21]
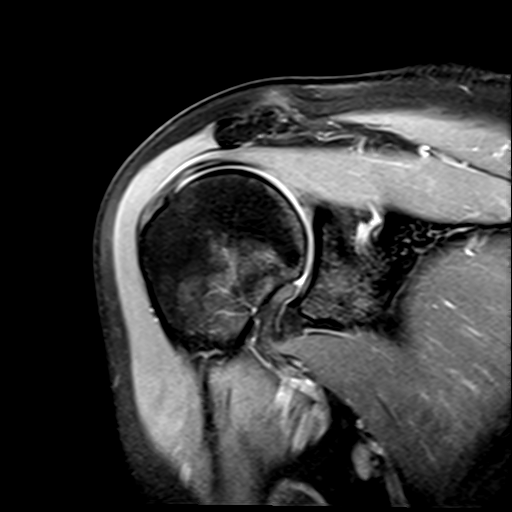
[im 15/21]
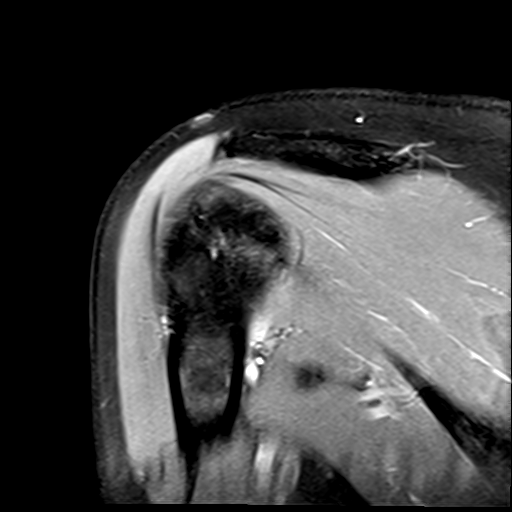
[im 18/21]
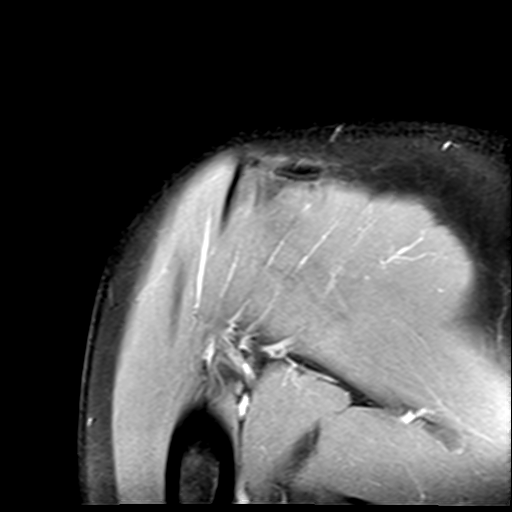
[im 21/21]
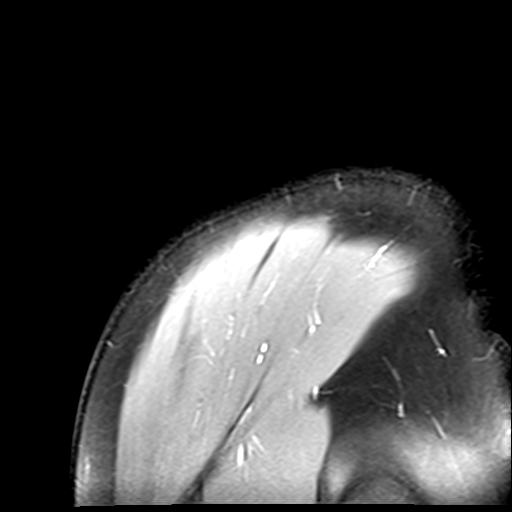

[Series 9: T2 fat-sat · oblique · right · 4.0mm · 0.44mm/px · 3 of 23 slices shown (2 of 2)]
[im 3/23]
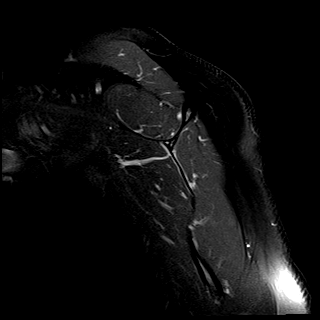
[im 12/23]
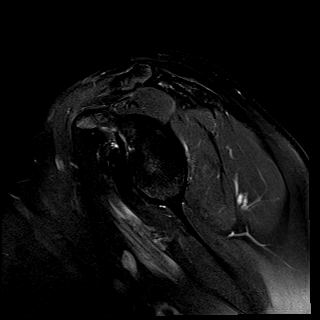
[im 20/23]
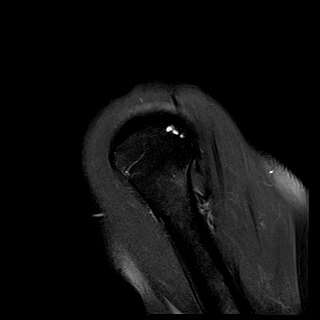

[22 of 40 positions shown; findings below may reference images not displayed]

FINDINGS: Rotator cuff: Supraspinatus tendon is intact. Mild tendinosis of the
infraspinatus tendon. Teres minor tendon is intact. Subscapularis
tendon is intact.

Muscles: No atrophy or fatty replacement of nor abnormal signal
within, the muscles of the rotator cuff.

Biceps long head: Mild tendinosis of the intra-articular portion of
the long head of the biceps tendon.

Acromioclavicular Joint: Mild arthropathy of the acromioclavicular
joint. Type I acromion. No subacromial/subdeltoid bursal fluid.

Glenohumeral Joint: No joint effusion. No chondral defect. Slight
thickening of the inferior joint capsule which is relatively low in
signal likely reflecting redundant joint capsule versus less likely
adhesive capsulitis.

Labrum:  Small posterior labral tear.

Bones:  No acute osseous abnormality.  No aggressive osseous lesion.

Other: No fluid collection or hematoma.
IMPRESSION: 1. Mild tendinosis of the infraspinatus tendon.
2. Mild tendinosis of the intra-articular portion of the long head
of the biceps tendon.

## 2019-09-04 ENCOUNTER — Other Ambulatory Visit: Payer: Self-pay

## 2019-09-14 DIAGNOSIS — M4722 Other spondylosis with radiculopathy, cervical region: Secondary | ICD-10-CM | POA: Diagnosis not present

## 2019-09-14 DIAGNOSIS — M542 Cervicalgia: Secondary | ICD-10-CM | POA: Diagnosis not present

## 2019-09-14 DIAGNOSIS — M67911 Unspecified disorder of synovium and tendon, right shoulder: Secondary | ICD-10-CM | POA: Diagnosis not present

## 2019-09-16 DIAGNOSIS — M9905 Segmental and somatic dysfunction of pelvic region: Secondary | ICD-10-CM | POA: Diagnosis not present

## 2019-09-16 DIAGNOSIS — M9903 Segmental and somatic dysfunction of lumbar region: Secondary | ICD-10-CM | POA: Diagnosis not present

## 2019-09-16 DIAGNOSIS — M5386 Other specified dorsopathies, lumbar region: Secondary | ICD-10-CM | POA: Diagnosis not present

## 2019-09-16 DIAGNOSIS — M9902 Segmental and somatic dysfunction of thoracic region: Secondary | ICD-10-CM | POA: Diagnosis not present

## 2019-09-28 DIAGNOSIS — M25511 Pain in right shoulder: Secondary | ICD-10-CM | POA: Diagnosis not present

## 2019-09-28 DIAGNOSIS — M542 Cervicalgia: Secondary | ICD-10-CM | POA: Diagnosis not present

## 2019-09-28 DIAGNOSIS — M5412 Radiculopathy, cervical region: Secondary | ICD-10-CM | POA: Diagnosis not present

## 2019-10-01 DIAGNOSIS — M25511 Pain in right shoulder: Secondary | ICD-10-CM | POA: Diagnosis not present

## 2019-10-01 DIAGNOSIS — M542 Cervicalgia: Secondary | ICD-10-CM | POA: Diagnosis not present

## 2019-10-01 DIAGNOSIS — M5412 Radiculopathy, cervical region: Secondary | ICD-10-CM | POA: Diagnosis not present

## 2019-10-08 DIAGNOSIS — M25511 Pain in right shoulder: Secondary | ICD-10-CM | POA: Diagnosis not present

## 2019-10-08 DIAGNOSIS — M5412 Radiculopathy, cervical region: Secondary | ICD-10-CM | POA: Diagnosis not present

## 2019-10-08 DIAGNOSIS — M542 Cervicalgia: Secondary | ICD-10-CM | POA: Diagnosis not present

## 2019-10-09 DIAGNOSIS — L700 Acne vulgaris: Secondary | ICD-10-CM | POA: Diagnosis not present

## 2019-10-09 DIAGNOSIS — L821 Other seborrheic keratosis: Secondary | ICD-10-CM | POA: Diagnosis not present

## 2019-10-12 DIAGNOSIS — M5412 Radiculopathy, cervical region: Secondary | ICD-10-CM | POA: Diagnosis not present

## 2019-10-12 DIAGNOSIS — M25511 Pain in right shoulder: Secondary | ICD-10-CM | POA: Diagnosis not present

## 2019-10-12 DIAGNOSIS — M542 Cervicalgia: Secondary | ICD-10-CM | POA: Diagnosis not present

## 2019-10-14 DIAGNOSIS — M5412 Radiculopathy, cervical region: Secondary | ICD-10-CM | POA: Diagnosis not present

## 2019-10-14 DIAGNOSIS — M25511 Pain in right shoulder: Secondary | ICD-10-CM | POA: Diagnosis not present

## 2019-10-14 DIAGNOSIS — M542 Cervicalgia: Secondary | ICD-10-CM | POA: Diagnosis not present

## 2019-10-22 DIAGNOSIS — M542 Cervicalgia: Secondary | ICD-10-CM | POA: Diagnosis not present

## 2019-10-22 DIAGNOSIS — M25511 Pain in right shoulder: Secondary | ICD-10-CM | POA: Diagnosis not present

## 2019-10-22 DIAGNOSIS — M5412 Radiculopathy, cervical region: Secondary | ICD-10-CM | POA: Diagnosis not present

## 2019-11-03 DIAGNOSIS — L570 Actinic keratosis: Secondary | ICD-10-CM | POA: Diagnosis not present

## 2019-11-23 DIAGNOSIS — L700 Acne vulgaris: Secondary | ICD-10-CM | POA: Diagnosis not present

## 2019-11-30 DIAGNOSIS — F4310 Post-traumatic stress disorder, unspecified: Secondary | ICD-10-CM | POA: Diagnosis not present

## 2019-12-02 ENCOUNTER — Other Ambulatory Visit: Payer: Self-pay

## 2019-12-02 ENCOUNTER — Other Ambulatory Visit: Payer: BC Managed Care – PPO

## 2019-12-02 DIAGNOSIS — Z20822 Contact with and (suspected) exposure to covid-19: Secondary | ICD-10-CM

## 2019-12-03 LAB — SARS-COV-2, NAA 2 DAY TAT

## 2019-12-03 LAB — NOVEL CORONAVIRUS, NAA: SARS-CoV-2, NAA: NOT DETECTED

## 2019-12-08 DIAGNOSIS — F4310 Post-traumatic stress disorder, unspecified: Secondary | ICD-10-CM | POA: Diagnosis not present

## 2019-12-09 DIAGNOSIS — M25511 Pain in right shoulder: Secondary | ICD-10-CM | POA: Diagnosis not present

## 2019-12-15 ENCOUNTER — Ambulatory Visit: Payer: Self-pay

## 2019-12-15 ENCOUNTER — Other Ambulatory Visit: Payer: Self-pay

## 2019-12-15 ENCOUNTER — Encounter: Payer: Self-pay | Admitting: Family Medicine

## 2019-12-15 ENCOUNTER — Ambulatory Visit (INDEPENDENT_AMBULATORY_CARE_PROVIDER_SITE_OTHER): Payer: BC Managed Care – PPO | Admitting: Family Medicine

## 2019-12-15 DIAGNOSIS — G8929 Other chronic pain: Secondary | ICD-10-CM | POA: Diagnosis not present

## 2019-12-15 DIAGNOSIS — F4310 Post-traumatic stress disorder, unspecified: Secondary | ICD-10-CM | POA: Diagnosis not present

## 2019-12-15 DIAGNOSIS — M25511 Pain in right shoulder: Secondary | ICD-10-CM | POA: Diagnosis not present

## 2019-12-15 NOTE — Progress Notes (Signed)
Office Visit Note   Patient: Kristina Hahn           Date of Birth: [birth year]           MRN: [id number] Visit Date: 12/15/2019 Requested by: No referring provider defined for this encounter. PCP: Patient, No Pcp Per  Subjective: Chief Complaint  Patient presents with  . Right Shoulder - Pain    Pickleball injury x 2. After initial injury, went to [facility] & had MRI + prednisone, methocarbamol & naproxen. Had PT with Aart. Was better til early October & 2nd injury. Pain returned. ROM ok but painful. Had cortisone inj a few months ago at [facility]    HPI: She is here at the request of Aart Schulenklopper for right shoulder pain.  Symptoms started about 2 months ago, she recalls feeling pain after playing a lot of pickle ball.  She does not recall a specific moment of injury.  She went to another orthopedic office and had an MRI scan done, she was given a subacromial injection and oral prednisone.  She had slight improvement in symptoms but was never pain-free.  She has been doing physical therapy and at the beginning of October, her pain got steadily worse.  Pain seems to be on the posterior lateral aspect of her shoulder.  She is right-hand dominant.  She is using naproxen as needed.               ROS:   All other systems were reviewed and are negative.  Objective: Vital Signs: There were no vitals taken for this visit.  Physical Exam:  General:  Alert and oriented, in no acute distress. Pulm:  Breathing unlabored. Psy:  Normal mood, congruent affect  Right shoulder: She has adhesive capsulitis with abduction of about 45 degrees and this afternoon, internal rotation barely past neutral, external rotation almost 90 degrees.  There is pain at the extremes of each.  Isometric rotator cuff strength is 5/5 throughout.  Negative speeds test, no pain over the [facility] joint.   Imaging: US Guided Needle Placement  Result Date: 12/15/2019 Ultrasound-guided right glenohumeral injection: After  sterile prep with Betadine, injected 8 cc 1% lidocaine without epinephrine and 40 mg methylprednisolone using a 22-gauge spinal needle, passing the needle from posterior approach into the glenohumeral joint.  Injectate was seen filling the joint capsule.  She had improvement in pain and range of motion during the anesthetic phase.    Assessment & Plan: 1.  Right shoulder adhesive capsulitis -Discussed options with her and elected to inject the glenohumeral joint today.  She will continue with physical therapy.  If pain persist, could contemplate MRI arthrogram to look for labrum pathology.     Procedures: No procedures performed  No notes on file     PMFS History: Patient Active Problem List   Diagnosis Date Noted  . Internal and external prolapsed hemorrhoids 07/06/2011   Past Medical History:  Diagnosis Date  . Kidney stones     History reviewed. No pertinent family history.  Past Surgical History:  Procedure Laterality Date  . KNEE ARTHROSCOPY W/ ACL RECONSTRUCTION  2009  . OVARIAN CYST REMOVAL     right   Social History   Occupational History  . Not on file  Tobacco Use  . Smoking status: Never Smoker  . Smokeless tobacco: Never Used  Substance and Sexual Activity  . Alcohol use: No  . Drug use: No  . Sexual activity: Not on file

## 2019-12-30 DIAGNOSIS — Z23 Encounter for immunization: Secondary | ICD-10-CM | POA: Diagnosis not present

## 2020-01-01 DIAGNOSIS — F4310 Post-traumatic stress disorder, unspecified: Secondary | ICD-10-CM | POA: Diagnosis not present

## 2020-01-04 DIAGNOSIS — L700 Acne vulgaris: Secondary | ICD-10-CM | POA: Diagnosis not present

## 2020-01-13 DIAGNOSIS — Z1231 Encounter for screening mammogram for malignant neoplasm of breast: Secondary | ICD-10-CM | POA: Diagnosis not present

## 2020-01-13 DIAGNOSIS — Z6821 Body mass index (BMI) 21.0-21.9, adult: Secondary | ICD-10-CM | POA: Diagnosis not present

## 2020-01-13 DIAGNOSIS — Z01419 Encounter for gynecological examination (general) (routine) without abnormal findings: Secondary | ICD-10-CM | POA: Diagnosis not present

## 2020-01-13 DIAGNOSIS — Z1151 Encounter for screening for human papillomavirus (HPV): Secondary | ICD-10-CM | POA: Diagnosis not present

## 2020-02-01 DIAGNOSIS — L700 Acne vulgaris: Secondary | ICD-10-CM | POA: Diagnosis not present

## 2020-03-17 ENCOUNTER — Encounter: Payer: Self-pay | Admitting: Family Medicine

## 2020-03-17 ENCOUNTER — Ambulatory Visit: Payer: BC Managed Care – PPO | Admitting: Family Medicine

## 2020-03-17 ENCOUNTER — Ambulatory Visit: Payer: Self-pay

## 2020-03-17 ENCOUNTER — Other Ambulatory Visit: Payer: Self-pay

## 2020-03-17 DIAGNOSIS — M25511 Pain in right shoulder: Secondary | ICD-10-CM | POA: Diagnosis not present

## 2020-03-17 DIAGNOSIS — G8929 Other chronic pain: Secondary | ICD-10-CM | POA: Diagnosis not present

## 2020-03-17 NOTE — Progress Notes (Signed)
   Office Visit Note   Patient: Kristina Hahn           Date of Birth: 1971-01-30           MRN: [id number] Visit Date: 03/17/2020 Requested by: No referring provider defined for this encounter. PCP: Patient, No Pcp Per  Subjective: Chief Complaint  Patient presents with  . Right Shoulder - Pain    Hurts all the time. Cannot sleep on the right side. Decreased ROM. Did not follow up with PT post the [facility] injection in October.    HPI: She is here with worsening right shoulder pain.  Injection in October helped temporarily, but she did not go to physical therapy afterward.  In the past couple weeks the pain has intensified and it hurts all the time, she is in tears today because of her pain.  She cannot sleep on her right side.  Very difficult to reach behind her back.                ROS:   All other systems were reviewed and are negative.  Objective: Vital Signs: There were no vitals taken for this visit.  Physical Exam:  General:  Alert and oriented, in no acute distress. Pulm:  Breathing unlabored. Psy:  Normal mood, congruent affect.  Right shoulder: Abduction is 80 degrees, external rotation is 75 degrees and internal rotation is just past neutral.  She has pain at the extremes.  Isometric rotator cuff strength is still 5/5.    Imaging: US Guided Needle Placement - No Linked Charges  Result Date: 03/17/2020 Ultrasound guided injection is preferred based studies that show increased duration, increased effect, greater accuracy, decreased procedural pain, increased response rate, and decreased cost with ultrasound guided versus blind injection.   Verbal informed consent obtained.  Time-out conducted.  Noted no overlying erythema, induration, or other signs of local infection. Ultrasound-guided right glenohumeral injection: After sterile prep with Betadine, injected 4 cc 0.25% bupivocaine without epinephrine and 6 mg betamethasone using a 22-gauge spinal needle, passing the needle  from posterior approach into the glenohumeral joint.  Injectate seen filling joint capsule.  Good immediate relief, with improved ROM.     Assessment & Plan: 1. right shoulder adhesive capsulitis with possible labrum tear -Discussed options and elected to inject the glenohumeral joint 1 more time, she will call and make an appointment as soon as able for physical therapy.  If she still fails to improve, then we will proceed with MRI arthrogram.  She did not want any medications for pain.     Procedures: No procedures performed        PMFS History: Patient Active Problem List   Diagnosis Date Noted  . Internal and external prolapsed hemorrhoids 07/06/2011   Past Medical History:  Diagnosis Date  . Kidney stones     History reviewed. No pertinent family history.  Past Surgical History:  Procedure Laterality Date  . KNEE ARTHROSCOPY W/ ACL RECONSTRUCTION  2009  . OVARIAN CYST REMOVAL     right   Social History   Occupational History  . Not on file  Tobacco Use  . Smoking status: Never Smoker  . Smokeless tobacco: Never Used  Substance and Sexual Activity  . Alcohol use: No  . Drug use: No  . Sexual activity: Not on file

## 2020-03-21 DIAGNOSIS — M25511 Pain in right shoulder: Secondary | ICD-10-CM | POA: Diagnosis not present

## 2020-03-25 DIAGNOSIS — M25511 Pain in right shoulder: Secondary | ICD-10-CM | POA: Diagnosis not present

## 2020-03-28 DIAGNOSIS — L700 Acne vulgaris: Secondary | ICD-10-CM | POA: Diagnosis not present

## 2020-04-01 DIAGNOSIS — M25511 Pain in right shoulder: Secondary | ICD-10-CM | POA: Diagnosis not present

## 2020-04-06 DIAGNOSIS — M25511 Pain in right shoulder: Secondary | ICD-10-CM | POA: Diagnosis not present

## 2020-04-13 DIAGNOSIS — M25511 Pain in right shoulder: Secondary | ICD-10-CM | POA: Diagnosis not present

## 2020-05-09 DIAGNOSIS — M9902 Segmental and somatic dysfunction of thoracic region: Secondary | ICD-10-CM | POA: Diagnosis not present

## 2020-05-09 DIAGNOSIS — M5386 Other specified dorsopathies, lumbar region: Secondary | ICD-10-CM | POA: Diagnosis not present

## 2020-05-09 DIAGNOSIS — M9903 Segmental and somatic dysfunction of lumbar region: Secondary | ICD-10-CM | POA: Diagnosis not present

## 2020-05-09 DIAGNOSIS — M9905 Segmental and somatic dysfunction of pelvic region: Secondary | ICD-10-CM | POA: Diagnosis not present

## 2020-05-18 DIAGNOSIS — D225 Melanocytic nevi of trunk: Secondary | ICD-10-CM | POA: Diagnosis not present

## 2020-05-18 DIAGNOSIS — L814 Other melanin hyperpigmentation: Secondary | ICD-10-CM | POA: Diagnosis not present

## 2020-05-18 DIAGNOSIS — L821 Other seborrheic keratosis: Secondary | ICD-10-CM | POA: Diagnosis not present

## 2020-05-18 DIAGNOSIS — Z86018 Personal history of other benign neoplasm: Secondary | ICD-10-CM | POA: Diagnosis not present

## 2020-05-23 DIAGNOSIS — M9903 Segmental and somatic dysfunction of lumbar region: Secondary | ICD-10-CM | POA: Diagnosis not present

## 2020-05-23 DIAGNOSIS — M9905 Segmental and somatic dysfunction of pelvic region: Secondary | ICD-10-CM | POA: Diagnosis not present

## 2020-05-23 DIAGNOSIS — M9902 Segmental and somatic dysfunction of thoracic region: Secondary | ICD-10-CM | POA: Diagnosis not present

## 2020-05-23 DIAGNOSIS — M5386 Other specified dorsopathies, lumbar region: Secondary | ICD-10-CM | POA: Diagnosis not present

## 2020-06-01 DIAGNOSIS — L700 Acne vulgaris: Secondary | ICD-10-CM | POA: Diagnosis not present

## 2020-06-06 ENCOUNTER — Ambulatory Visit: Payer: BC Managed Care – PPO | Admitting: Family Medicine

## 2020-06-13 ENCOUNTER — Encounter: Payer: Self-pay | Admitting: Family Medicine

## 2020-06-13 ENCOUNTER — Ambulatory Visit (INDEPENDENT_AMBULATORY_CARE_PROVIDER_SITE_OTHER): Payer: BC Managed Care – PPO | Admitting: Family Medicine

## 2020-06-13 ENCOUNTER — Other Ambulatory Visit: Payer: Self-pay

## 2020-06-13 DIAGNOSIS — M25511 Pain in right shoulder: Secondary | ICD-10-CM | POA: Diagnosis not present

## 2020-06-13 DIAGNOSIS — G8929 Other chronic pain: Secondary | ICD-10-CM | POA: Diagnosis not present

## 2020-06-13 MED ORDER — DIAZEPAM 5 MG PO TABS: ORAL_TABLET | ORAL | 0 refills | Status: AC

## 2020-06-13 NOTE — Progress Notes (Signed)
   Office Visit Note   Patient: Kristina Hahn           Date of Birth: 22-Nov-1970           MRN: [id number] Visit Date: 06/13/2020 Requested by: No referring provider defined for this encounter. PCP: Patient, No Pcp Per (Inactive)  Subjective: Chief Complaint  Patient presents with  . Right Shoulder - Pain    Persistent pain in the shoulder. It is not as bad as at the last ov, but still there. Hurts especially after small, repetitive movements with the shoulder. She has "grabbing cramps" in the posterior shoulder.  Frustrated with not being able to go back to her normal life activities -- wants to be able to play pickleball and tennis.     HPI: She is here for follow-up chronic right shoulder pain.  She is not making any significant progress.  Still cannot do activities that she used to do such as pickleball.  Pain on the posterior aspect of the shoulder, limited range of motion.              ROS:   All other systems were reviewed and are negative.  Objective: Vital Signs: There were no vitals taken for this visit.  Physical Exam:  General:  Alert and oriented, in no acute distress. Pulm:  Breathing unlabored. Psy:  Normal mood, congruent affect.  Right shoulder: She has external rotation of about 75 degrees versus 100 degrees on the left.  Internal rotation is limited to about 30 degrees versus 90 degrees on the left.  Isometric rotator cuff strength is still 5/5.  Imaging: No results found.  Assessment & Plan: 1.  Right shoulder adhesive capsulitis, cannot rule out labrum tear or rotator cuff tear. -We will proceed with MRI arthrogram to further evaluate.     Procedures: No procedures performed        PMFS History: Patient Active Problem List   Diagnosis Date Noted  . Internal and external prolapsed hemorrhoids 07/06/2011   Past Medical History:  Diagnosis Date  . Kidney stones     History reviewed. No pertinent family history.  Past Surgical History:   Procedure Laterality Date  . KNEE ARTHROSCOPY W/ ACL RECONSTRUCTION  2009  . OVARIAN CYST REMOVAL     right   Social History   Occupational History  . Not on file  Tobacco Use  . Smoking status: Never Smoker  . Smokeless tobacco: Never Used  Substance and Sexual Activity  . Alcohol use: No  . Drug use: No  . Sexual activity: Not on file

## 2020-06-18 DIAGNOSIS — Z20822 Contact with and (suspected) exposure to covid-19: Secondary | ICD-10-CM | POA: Diagnosis not present

## 2020-06-20 DIAGNOSIS — M9902 Segmental and somatic dysfunction of thoracic region: Secondary | ICD-10-CM | POA: Diagnosis not present

## 2020-06-20 DIAGNOSIS — M9903 Segmental and somatic dysfunction of lumbar region: Secondary | ICD-10-CM | POA: Diagnosis not present

## 2020-06-20 DIAGNOSIS — M9905 Segmental and somatic dysfunction of pelvic region: Secondary | ICD-10-CM | POA: Diagnosis not present

## 2020-06-20 DIAGNOSIS — M5386 Other specified dorsopathies, lumbar region: Secondary | ICD-10-CM | POA: Diagnosis not present

## 2020-07-08 ENCOUNTER — Ambulatory Visit
Admission: RE | Admit: 2020-07-08 | Discharge: 2020-07-08 | Disposition: A | Discharge status: Home / Self Care | Payer: BC Managed Care – PPO | Source: Ambulatory Visit | Attending: Family Medicine | Admitting: Family Medicine

## 2020-07-08 DIAGNOSIS — M25511 Pain in right shoulder: Secondary | ICD-10-CM

## 2020-07-08 DIAGNOSIS — S43431A Superior glenoid labrum lesion of right shoulder, initial encounter: Secondary | ICD-10-CM | POA: Diagnosis not present

## 2020-07-08 DIAGNOSIS — G8929 Other chronic pain: Secondary | ICD-10-CM

## 2020-07-08 IMAGING — XA DG FLUORO GUIDE NDL PLC/BX
2 series · 2 of 2 positions shown · non-contrast
Comparison: none

CLINICAL DATA: 49-year-old female with chronic right shoulder pain

[Series 1: vasc standard · 1 of 1 slices shown (1 of 2)]
[im 1/1]
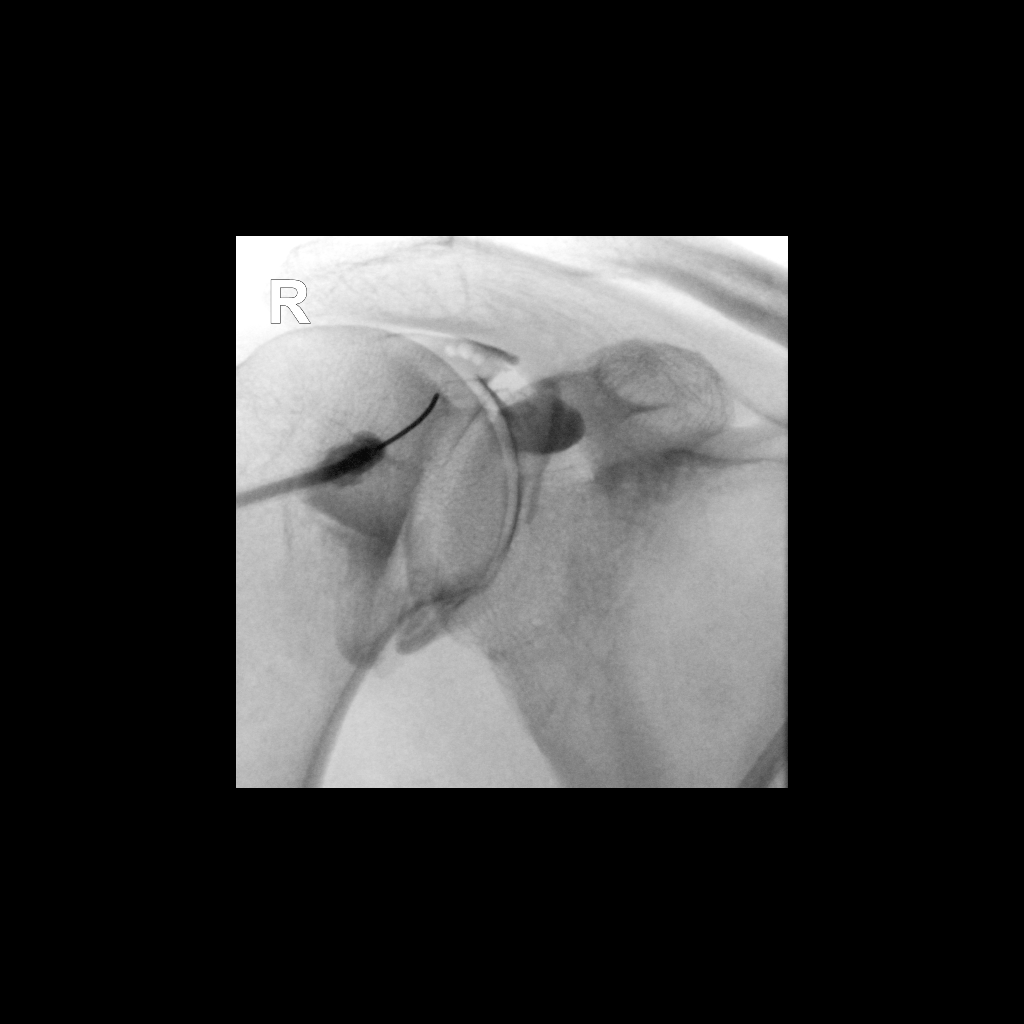

[Series 2: vasc standard · 1 of 1 slices shown (2 of 2)]
[im 1/1]
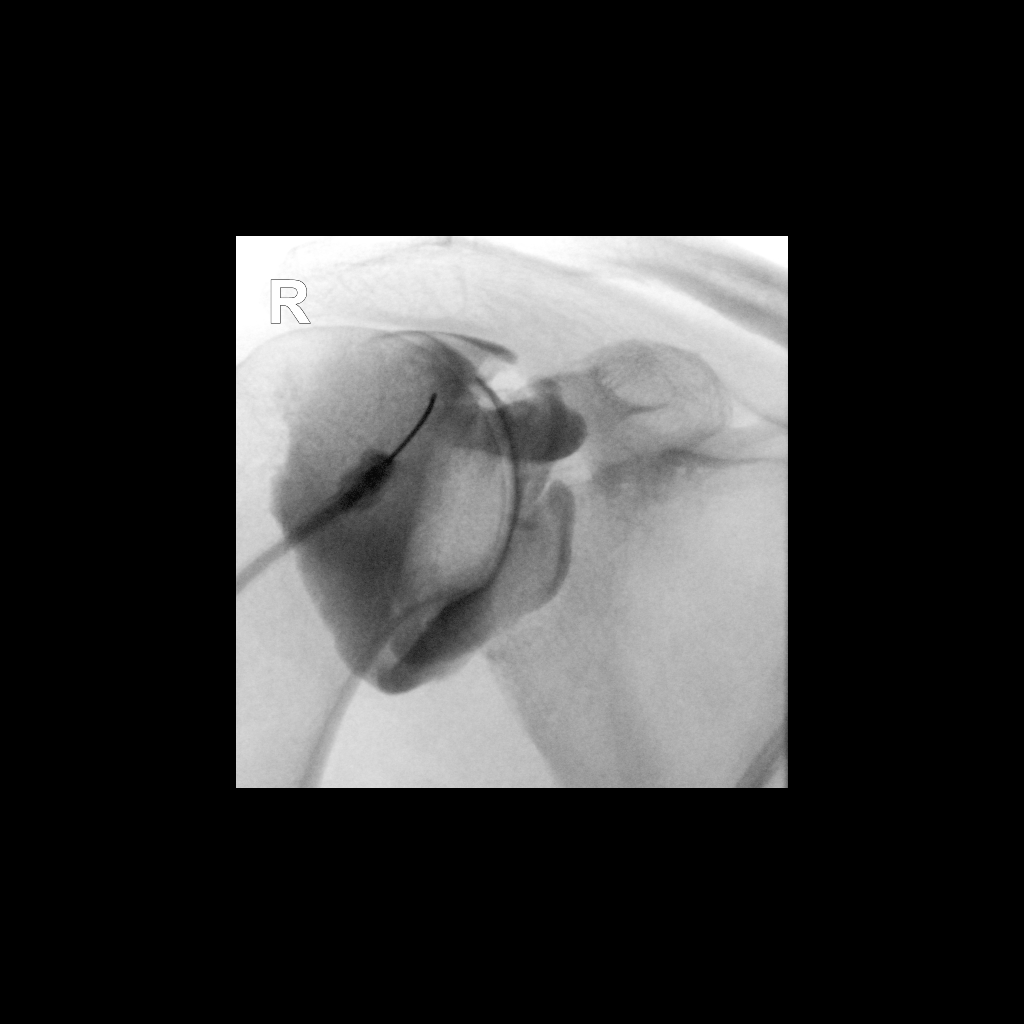

[2 of 2 positions shown; findings below may reference images not displayed]

EXAM:
SHOULDER INJECTION FOR MRI

FLUOROSCOPY TIME:  0 minutes 12 seconds 0.7 mGy

PROCEDURE:
After a thorough discussion of risks and benefits of the procedure,
written and oral informed consent was obtained. The consent
discussion included the risk of bleeding, infection and injury to
nerves and adjacent blood vessels. Extra-articular injection was
also a possible risk discussed.

Preliminary localization was performed over the right shoulder. The
area was marked over superior medial anterior humeral head.

After prep and drape in the usual sterile fashion, the skin and
deeper subcutaneous tissues were anesthetized with 1% Lidocaine
without Epinephrine. Under fluoroscopic guidance, a 22 gauge
inch spinal needle was advanced into the joint over the superior
medial anterior humeral head using an anterior approach.
Intra-articular injection of Lidocaine was performed which flowed
freely and subsequently the joint was distended with 15 ml of a
[DATE] dilution of Multihance contrast. The MR arthrogram solution
was as follows: 15 ml of omnipaque 180 contrast agent, 0.1 mL
Multihance, 5 ml of 1% Lidocaine. An end point was felt as well as
the patient experiencing pressure and the injection was
discontinued, the needle removed, and a sterile dressing applied.
The patient was taken to MRI for subsequent imaging.

The patient tolerated the procedure well and there were no
complications.
IMPRESSION: Successful right shoulder fluoroscopically guided injection.

## 2020-07-08 IMAGING — MR MR SHOULDER*R* W/CM
6 series · 40 of 40 positions shown · IV contrast (agent unspecified)
Comparison: MRI right shoulder dated [DATE].

CLINICAL DATA: Chronic right shoulder pain and limited range of
motion. No injury or prior surgery.

EXAM:
MR ARTHROGRAM OF THE RIGHT SHOULDER
TECHNIQUE: Multiplanar, multisequence MR imaging of the right shoulder was
performed following the administration of intra-articular contrast.
CONTRAST:  See Injection Documentation.

[Series 3: T1 fat-sat · axial · 4.0mm · 0.27mm/px · z∈[-44,+40]mm · 6 of 18 slices shown (1 of 4)]
[im 1/18]
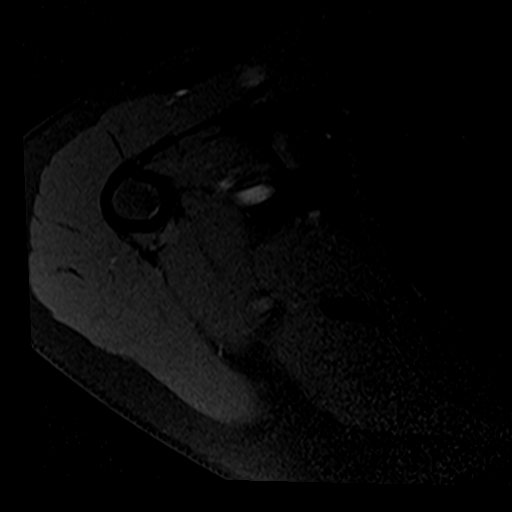
[im 4/18]
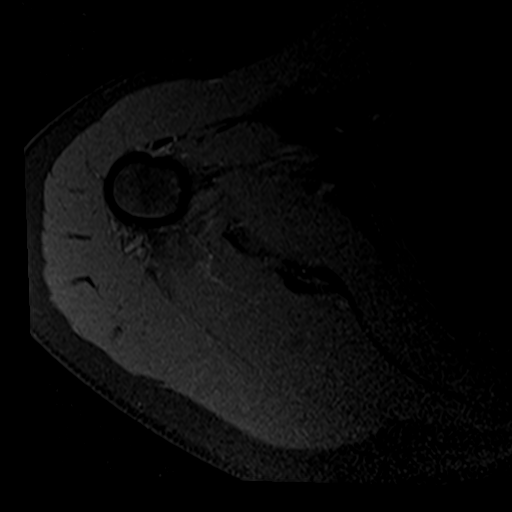
[im 7/18]
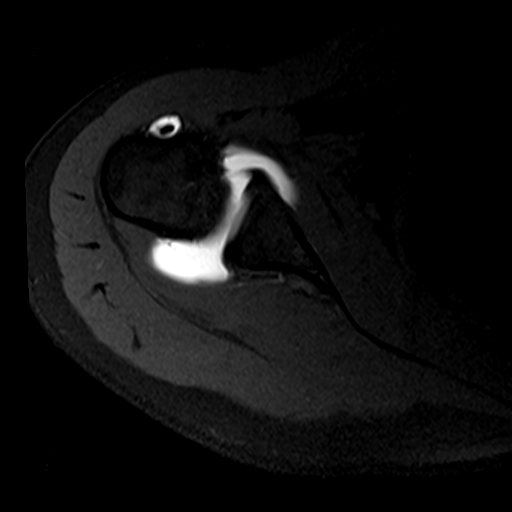
[im 11/18]
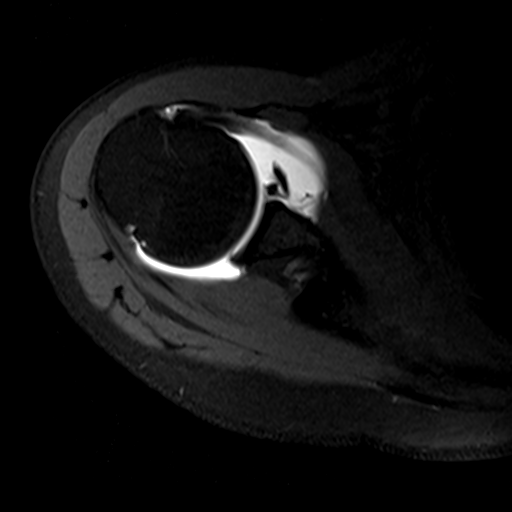
[im 14/18]
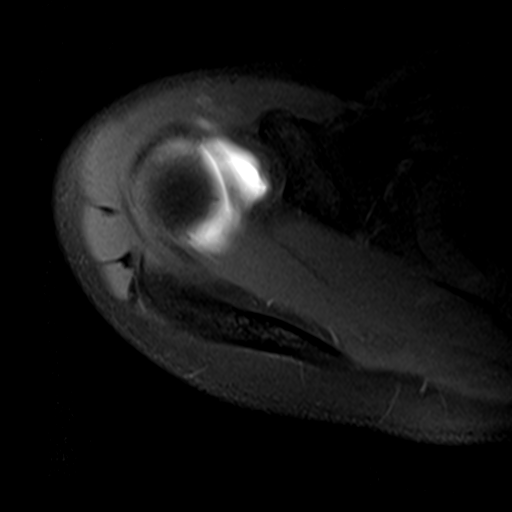
[im 18/18]
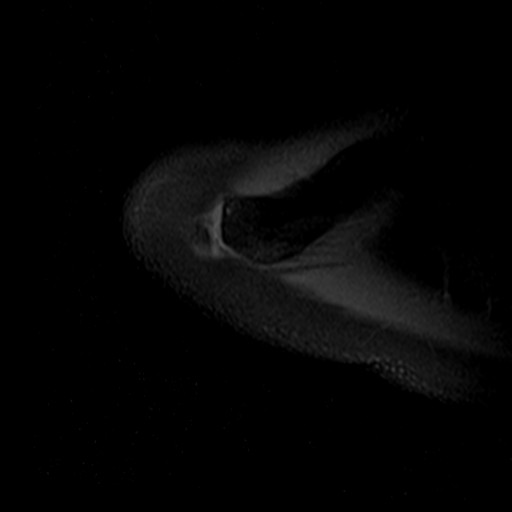

[Series 4: T2 fat-sat · coronal · 4.0mm · 0.55mm/px · 7 of 21 slices shown (1 of 2)]
[im 1/21]
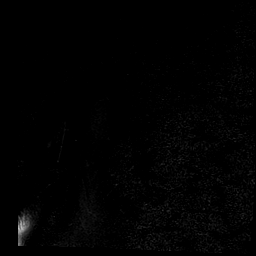
[im 4/21]
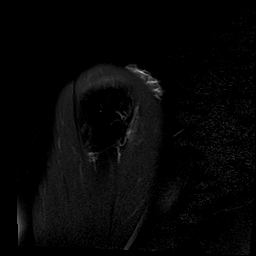
[im 7/21]
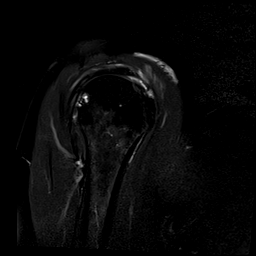
[im 11/21]
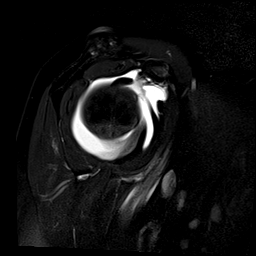
[im 14/21]
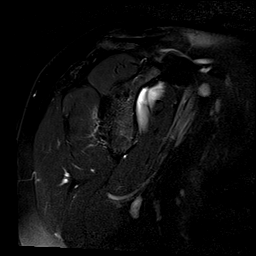
[im 17/21]
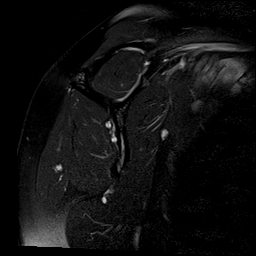
[im 21/21]
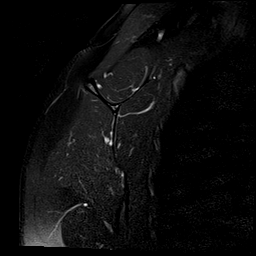

[Series 5: T1 fat-sat · oblique · 4.0mm · 0.55mm/px · 7 of 21 slices shown (2 of 4)]
[im 1/21]
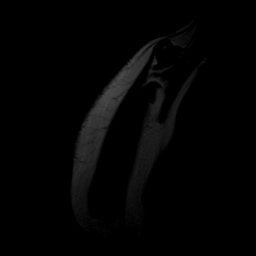
[im 4/21]
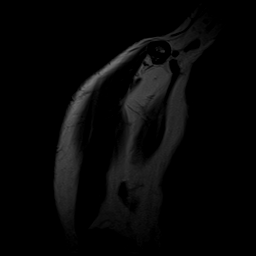
[im 7/21]
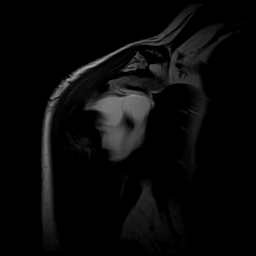
[im 11/21]
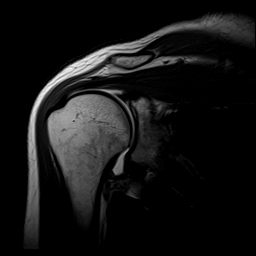
[im 14/21]
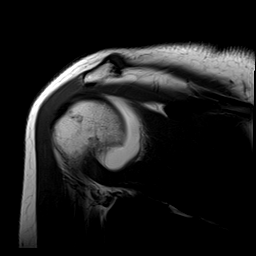
[im 17/21]
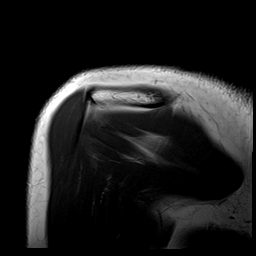
[im 21/21]
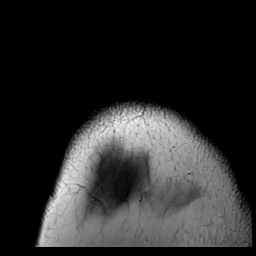

[Series 6: T1 fat-sat · oblique · 4.0mm · 0.55mm/px · 7 of 21 slices shown (3 of 4)]
[im 1/21]
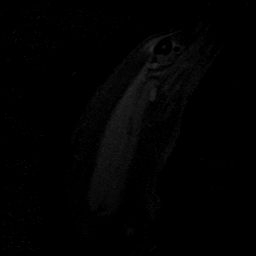
[im 4/21]
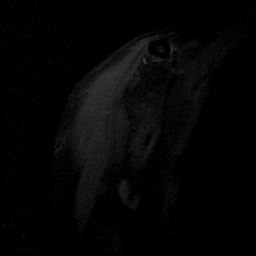
[im 7/21]
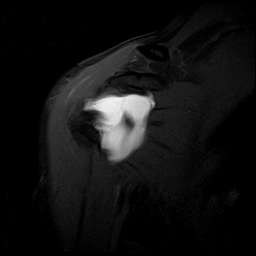
[im 11/21]
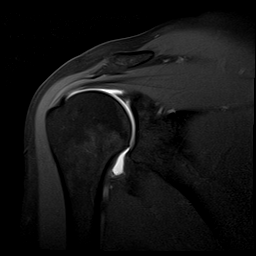
[im 14/21]
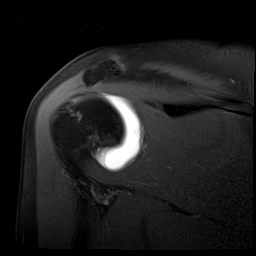
[im 17/21]
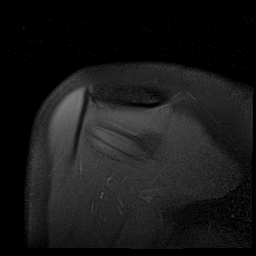
[im 21/21]
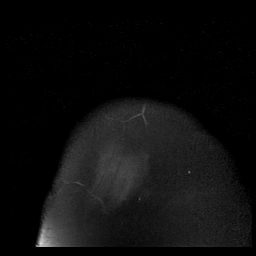

[Series 7: T2 fat-sat · oblique · 4.0mm · 0.55mm/px · 7 of 21 slices shown (2 of 2)]
[im 1/21]
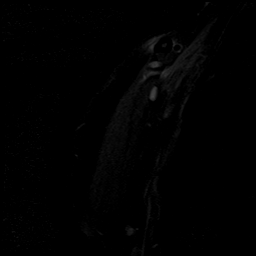
[im 4/21]
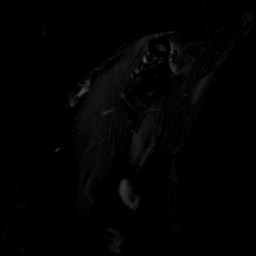
[im 7/21]
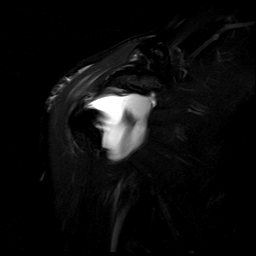
[im 11/21]
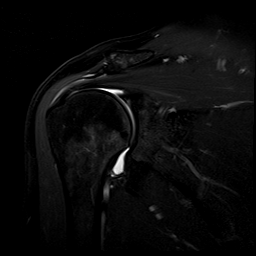
[im 14/21]
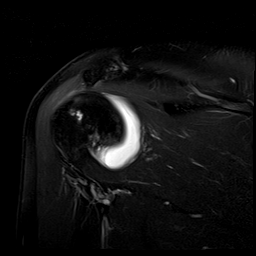
[im 17/21]
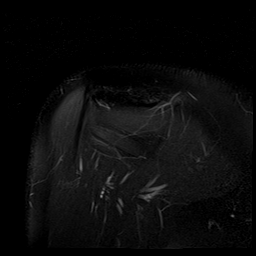
[im 21/21]
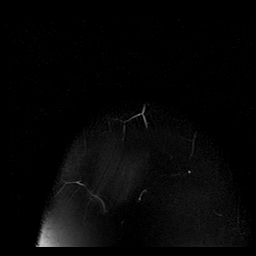

[Series 11: T1 fat-sat · oblique · 4.0mm · 0.59mm/px · 6 of 19 slices shown (4 of 4)]
[im 1/19]
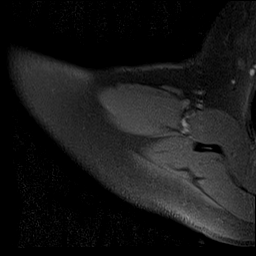
[im 4/19]
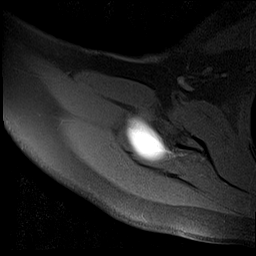
[im 8/19]
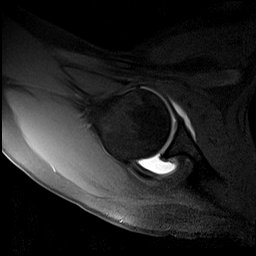
[im 11/19]
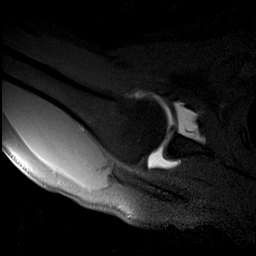
[im 15/19]
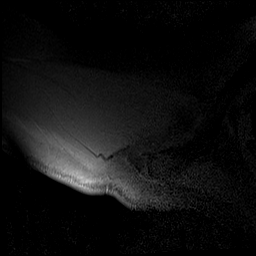
[im 19/19]
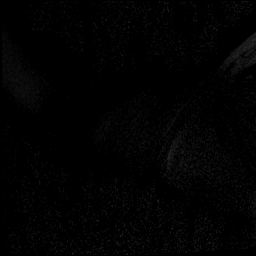

[40 of 40 positions shown; findings below may reference images not displayed]

FINDINGS: Rotator cuff:  Intact without significant tendinosis.

Muscles:  No focal muscular atrophy or edema.

Biceps long head:  Intact and normally positioned.

Acromioclavicular Joint: The acromion is type I. Unchanged mild
acromioclavicular degenerative changes. Trace fluid but no contrast
within the subacromial/subdeltoid bursa.

Glenohumeral Joint: Distended with intra-articular contrast. No
chondral defect.

Labrum: Small superior labral tear (series 6 and 7, images 11-12).
Remaining labrum is intact. Anterior superior sublabral foramen.

Bones: No acute fracture or dislocation. No suspicious bone lesion.

Other: None.
IMPRESSION: 1. Small superior labral tear.
2. Intact rotator cuff.
3. Unchanged mild acromioclavicular osteoarthritis.

## 2020-07-08 MED ORDER — IOPAMIDOL (ISOVUE-M 200) INJECTION 41%
12.0000 mL | Freq: Once | INTRAMUSCULAR | Status: AC
  Administered 2020-07-08: 12 mL via INTRA_ARTICULAR

## 2020-07-11 ENCOUNTER — Telehealth: Payer: Self-pay | Admitting: Family Medicine

## 2020-07-11 DIAGNOSIS — M9903 Segmental and somatic dysfunction of lumbar region: Secondary | ICD-10-CM | POA: Diagnosis not present

## 2020-07-11 DIAGNOSIS — M9905 Segmental and somatic dysfunction of pelvic region: Secondary | ICD-10-CM | POA: Diagnosis not present

## 2020-07-11 DIAGNOSIS — M9902 Segmental and somatic dysfunction of thoracic region: Secondary | ICD-10-CM | POA: Diagnosis not present

## 2020-07-11 DIAGNOSIS — M5386 Other specified dorsopathies, lumbar region: Secondary | ICD-10-CM | POA: Diagnosis not present

## 2020-07-11 NOTE — Telephone Encounter (Signed)
Appt cx.

## 2020-07-11 NOTE — Telephone Encounter (Signed)
MRI shows a small tear of the labrum cartilage.  Rotator cuff is intact.  Options include giving it more time and more physical therapy, versus getting a surgical consult to address the labrum tear and to do a "manipulation under anesthesia".

## 2020-07-12 ENCOUNTER — Ambulatory Visit: Payer: BC Managed Care – PPO | Admitting: Family Medicine

## 2020-07-20 ENCOUNTER — Ambulatory Visit (INDEPENDENT_AMBULATORY_CARE_PROVIDER_SITE_OTHER): Payer: BC Managed Care – PPO | Admitting: Orthopedic Surgery

## 2020-07-20 ENCOUNTER — Encounter: Payer: Self-pay | Admitting: Orthopedic Surgery

## 2020-07-20 ENCOUNTER — Other Ambulatory Visit: Payer: Self-pay

## 2020-07-20 VITALS — Ht 63.0 in | Wt 115.0 lb

## 2020-07-20 DIAGNOSIS — G8929 Other chronic pain: Secondary | ICD-10-CM | POA: Diagnosis not present

## 2020-07-20 DIAGNOSIS — M25511 Pain in right shoulder: Secondary | ICD-10-CM

## 2020-07-25 ENCOUNTER — Encounter: Payer: Self-pay | Admitting: Orthopedic Surgery

## 2020-07-25 NOTE — Progress Notes (Signed)
Office Visit Note   Patient: Kristina Hahn           Date of Birth: [birth year]           MRN: [id number] Visit Date: 07/20/2020 Requested by: No referring provider defined for this encounter. PCP: Patient, No Pcp Per (Inactive)  Subjective: Chief Complaint  Patient presents with   Right Shoulder - Pain    HPI: Kristina Hahn is a 50 y.o. female who presents to the office complaining of right shoulder pain.  Patient reports right shoulder pain over the last year.  She was playing a lot of pickle ball after picking up the sport and then she fell once onto the right shoulder and pushed through finishing her match.  She had increasing pain after this April 2021 incident.  She notes a catching sensation in the shoulder at times.  Most of her pain is in the lateral shoulder.  She complains of decreased range of motion.  She was initially having a lot of neck pain with numbness and tingling and radiation down the arm but this has pretty much stopped completely since going to physical therapy.  However the shoulder pain persists.  She has had 3 injections in the shoulder.  First injection was at [facility].  Second and third injection were ultrasound-guided glenohumeral injections with Dr. Prince Rome.  These helped for a couple months but have not resolved her pain.  She has been to physical therapy at [facility] physical therapy as well as seeing a chiropractor.  She has no history of prior dislocation, shoulder issues in the past, shoulder surgery.  She wakes with pain nightly and does note occasional clicking sensation.  She finds it hard to type.  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  She feels her pain is worsening..                ROS: All systems reviewed are negative as they relate to the chief complaint within the history of present illness.  Patient denies fevers or chills.  Assessment & Plan: Visit Diagnoses:  1. Chronic right shoulder pain     Plan: Patient is a  50 year old female who presents complaining of right shoulder pain.  She has history of shoulder pain since a pickleball injury where she fell in April 2021.  She has had persistent shoulder pain since then that has only been slightly temporarily alleviated by shoulder injections.  She was having neck pain at the time but this is stopped with physical therapy.  She has occasional clicking in the shoulder.  Decreased range of motion of passive motion of the shoulder compared with the contralateral side on exam today.  No weakness on exam.  She had MRI of the right shoulder that was reviewed with her today and revealed a small superior labral tear with intact rotator cuff.  Discussed options available to patient.  She feels her pain is getting worse and it wakes her up at night every night.  She would like to have more definitive treatment.  Plan to post patient for right shoulder manipulation under anesthesia with possible rotator interval release and biceps tenodesis.  Discussed the risks and benefits of the procedure including the risk of injury to the labrum or rotator cuff or humerus from manipulation, shoulder stiffness that go and get better with therapy, nerve/vessel damage, need for revision surgery, etc.  Plan to follow-up after procedure.  Although her range of motion loss  is not as significant as others she still does have a side to side difference.  That combined with her small superior labral tear may be a kernel of irritation which is keeping her shoulder inflamed.  She has exhausted all nonoperative treatment measures including therapy injections and oral medication.  Her 2 options are basically to live with what she has which she has been doing for about 9 to 12 months versus an intervention which may help her in terms of achieving full range of motion and removing the nidus of irritation which could be keeping her shoulder inflamed.  Follow-Up Instructions: No follow-ups on file.   Orders:  No  orders of the defined types were placed in this encounter.  No orders of the defined types were placed in this encounter.     Procedures: No procedures performed   Clinical Data: No additional findings.  Objective: Vital Signs: Ht 5\' 3"  (1.6 m)   Wt 115 lb (52.2 kg)   BMI 20.37 kg/m   Physical Exam:  Constitutional: Patient appears well-developed HEENT:  Head: Normocephalic Eyes:EOM are normal Neck: Normal range of motion Cardiovascular: Normal rate Pulmonary/chest: Effort normal Neurologic: Patient is alert Skin: Skin is warm Psychiatric: Patient has normal mood and affect  Ortho Exam: Ortho exam demonstrates left shoulder with 50 degrees external rotation, 130 degrees abduction, 180 degrees forward flexion.  Right shoulder with 40 degrees external rotation, 85 degrees abduction, 140 degrees forward flexion.  No weakness of the supraspinatus, infraspinatus, subscapularis.  No significant crepitus noted with passive motion of the shoulder.  Tenderness over the bicipital groove.  Positive O'Brien sign.  No tenderness over the [facility] joint.  Increased pain with passive motion of the shoulder at the terminal ranges of motion.  Active motion is equivalent to passive motion.  No tenderness throughout the axial cervical spine.  Negative Spurling sign.  5/5 motor strength of bilateral grip strength, finger abduction, pronation/supination, bicep, tricep, deltoid.  Negative Hornblower sign.  Negative drop arm test.  Specialty Comments:  No specialty comments available.  Imaging: No results found.   PMFS History: Patient Active Problem List   Diagnosis Date Noted   Internal and external prolapsed hemorrhoids 07/06/2011   Past Medical History:  Diagnosis Date   Kidney stones     No family history on file.  Past Surgical History:  Procedure Laterality Date   KNEE ARTHROSCOPY W/ ACL RECONSTRUCTION  2009   OVARIAN CYST REMOVAL     right   Social History   Occupational History    Not on file  Tobacco Use   Smoking status: Never Smoker   Smokeless tobacco: Never Used  Substance and Sexual Activity   Alcohol use: No   Drug use: No   Sexual activity: Not on file

## 2020-08-01 ENCOUNTER — Telehealth: Payer: Self-pay | Admitting: Orthopedic Surgery

## 2020-08-01 NOTE — Telephone Encounter (Signed)
Received $25.00 cash,medical records release form and FMLA/Disability form from patient    Forwarding to [facility] today

## 2020-08-08 DIAGNOSIS — M9903 Segmental and somatic dysfunction of lumbar region: Secondary | ICD-10-CM | POA: Diagnosis not present

## 2020-08-08 DIAGNOSIS — M9902 Segmental and somatic dysfunction of thoracic region: Secondary | ICD-10-CM | POA: Diagnosis not present

## 2020-08-08 DIAGNOSIS — M9905 Segmental and somatic dysfunction of pelvic region: Secondary | ICD-10-CM | POA: Diagnosis not present

## 2020-08-08 DIAGNOSIS — M5386 Other specified dorsopathies, lumbar region: Secondary | ICD-10-CM | POA: Diagnosis not present

## 2020-08-10 DIAGNOSIS — L603 Nail dystrophy: Secondary | ICD-10-CM | POA: Diagnosis not present

## 2020-08-10 DIAGNOSIS — L700 Acne vulgaris: Secondary | ICD-10-CM | POA: Diagnosis not present

## 2020-08-10 DIAGNOSIS — L538 Other specified erythematous conditions: Secondary | ICD-10-CM | POA: Diagnosis not present

## 2020-09-20 DIAGNOSIS — M9905 Segmental and somatic dysfunction of pelvic region: Secondary | ICD-10-CM | POA: Diagnosis not present

## 2020-09-20 DIAGNOSIS — M5386 Other specified dorsopathies, lumbar region: Secondary | ICD-10-CM | POA: Diagnosis not present

## 2020-09-20 DIAGNOSIS — M9902 Segmental and somatic dysfunction of thoracic region: Secondary | ICD-10-CM | POA: Diagnosis not present

## 2020-09-20 DIAGNOSIS — M9903 Segmental and somatic dysfunction of lumbar region: Secondary | ICD-10-CM | POA: Diagnosis not present

## 2020-09-28 ENCOUNTER — Encounter: Payer: BC Managed Care – PPO | Admitting: Orthopedic Surgery

## 2020-10-04 DIAGNOSIS — M9905 Segmental and somatic dysfunction of pelvic region: Secondary | ICD-10-CM | POA: Diagnosis not present

## 2020-10-04 DIAGNOSIS — M9902 Segmental and somatic dysfunction of thoracic region: Secondary | ICD-10-CM | POA: Diagnosis not present

## 2020-10-04 DIAGNOSIS — M5386 Other specified dorsopathies, lumbar region: Secondary | ICD-10-CM | POA: Diagnosis not present

## 2020-10-04 DIAGNOSIS — M9903 Segmental and somatic dysfunction of lumbar region: Secondary | ICD-10-CM | POA: Diagnosis not present

## 2020-10-10 DIAGNOSIS — L700 Acne vulgaris: Secondary | ICD-10-CM | POA: Diagnosis not present

## 2020-10-24 ENCOUNTER — Encounter: Payer: BC Managed Care – PPO | Admitting: Orthopedic Surgery

## 2020-12-05 DIAGNOSIS — K644 Residual hemorrhoidal skin tags: Secondary | ICD-10-CM | POA: Diagnosis not present

## 2020-12-05 DIAGNOSIS — K648 Other hemorrhoids: Secondary | ICD-10-CM | POA: Diagnosis not present

## 2020-12-05 DIAGNOSIS — Z1211 Encounter for screening for malignant neoplasm of colon: Secondary | ICD-10-CM | POA: Diagnosis not present

## 2021-03-24 DIAGNOSIS — F0781 Postconcussional syndrome: Secondary | ICD-10-CM | POA: Diagnosis not present

## 2021-03-30 DIAGNOSIS — J018 Other acute sinusitis: Secondary | ICD-10-CM | POA: Diagnosis not present

## 2021-03-30 DIAGNOSIS — Z03818 Encounter for observation for suspected exposure to other biological agents ruled out: Secondary | ICD-10-CM | POA: Diagnosis not present

## 2021-03-30 DIAGNOSIS — Z20822 Contact with and (suspected) exposure to covid-19: Secondary | ICD-10-CM | POA: Diagnosis not present

## 2021-04-05 DIAGNOSIS — Z1231 Encounter for screening mammogram for malignant neoplasm of breast: Secondary | ICD-10-CM | POA: Diagnosis not present

## 2021-04-05 DIAGNOSIS — Z124 Encounter for screening for malignant neoplasm of cervix: Secondary | ICD-10-CM | POA: Diagnosis not present

## 2021-04-05 DIAGNOSIS — Z113 Encounter for screening for infections with a predominantly sexual mode of transmission: Secondary | ICD-10-CM | POA: Diagnosis not present

## 2021-04-05 DIAGNOSIS — Z6820 Body mass index (BMI) 20.0-20.9, adult: Secondary | ICD-10-CM | POA: Diagnosis not present

## 2021-04-05 DIAGNOSIS — R87610 Atypical squamous cells of undetermined significance on cytologic smear of cervix (ASC-US): Secondary | ICD-10-CM | POA: Diagnosis not present

## 2021-04-05 DIAGNOSIS — Z01419 Encounter for gynecological examination (general) (routine) without abnormal findings: Secondary | ICD-10-CM | POA: Diagnosis not present

## 2021-04-12 DIAGNOSIS — J40 Bronchitis, not specified as acute or chronic: Secondary | ICD-10-CM | POA: Diagnosis not present

## 2021-04-12 DIAGNOSIS — R059 Cough, unspecified: Secondary | ICD-10-CM | POA: Diagnosis not present

## 2021-04-26 DIAGNOSIS — Z130 Encounter for screening for diseases of the blood and blood-forming organs and certain disorders involving the immune mechanism: Secondary | ICD-10-CM | POA: Diagnosis not present

## 2021-04-26 DIAGNOSIS — Z0000 Encounter for general adult medical examination without abnormal findings: Secondary | ICD-10-CM | POA: Diagnosis not present

## 2021-04-26 DIAGNOSIS — J40 Bronchitis, not specified as acute or chronic: Secondary | ICD-10-CM | POA: Diagnosis not present

## 2021-04-26 DIAGNOSIS — Z13220 Encounter for screening for lipoid disorders: Secondary | ICD-10-CM | POA: Diagnosis not present

## 2021-05-04 DIAGNOSIS — M1990 Unspecified osteoarthritis, unspecified site: Secondary | ICD-10-CM | POA: Diagnosis not present

## 2021-05-16 DIAGNOSIS — G2581 Restless legs syndrome: Secondary | ICD-10-CM | POA: Diagnosis not present

## 2021-05-16 DIAGNOSIS — M79645 Pain in left finger(s): Secondary | ICD-10-CM | POA: Diagnosis not present

## 2021-05-16 DIAGNOSIS — M79644 Pain in right finger(s): Secondary | ICD-10-CM | POA: Diagnosis not present

## 2021-05-22 DIAGNOSIS — L821 Other seborrheic keratosis: Secondary | ICD-10-CM | POA: Diagnosis not present

## 2021-05-22 DIAGNOSIS — D225 Melanocytic nevi of trunk: Secondary | ICD-10-CM | POA: Diagnosis not present

## 2021-05-22 DIAGNOSIS — L814 Other melanin hyperpigmentation: Secondary | ICD-10-CM | POA: Diagnosis not present

## 2021-05-22 DIAGNOSIS — D2272 Melanocytic nevi of left lower limb, including hip: Secondary | ICD-10-CM | POA: Diagnosis not present

## 2021-07-07 DIAGNOSIS — K648 Other hemorrhoids: Secondary | ICD-10-CM | POA: Diagnosis not present

## 2021-08-30 DIAGNOSIS — L089 Local infection of the skin and subcutaneous tissue, unspecified: Secondary | ICD-10-CM | POA: Diagnosis not present

## 2021-09-25 DIAGNOSIS — M9903 Segmental and somatic dysfunction of lumbar region: Secondary | ICD-10-CM | POA: Diagnosis not present

## 2021-09-25 DIAGNOSIS — M5386 Other specified dorsopathies, lumbar region: Secondary | ICD-10-CM | POA: Diagnosis not present

## 2021-09-25 DIAGNOSIS — M9902 Segmental and somatic dysfunction of thoracic region: Secondary | ICD-10-CM | POA: Diagnosis not present

## 2021-09-25 DIAGNOSIS — M9905 Segmental and somatic dysfunction of pelvic region: Secondary | ICD-10-CM | POA: Diagnosis not present

## 2021-09-25 DIAGNOSIS — M9901 Segmental and somatic dysfunction of cervical region: Secondary | ICD-10-CM | POA: Diagnosis not present

## 2021-11-17 ENCOUNTER — Encounter (HOSPITAL_COMMUNITY): Payer: Self-pay | Admitting: *Deleted

## 2021-11-17 ENCOUNTER — Other Ambulatory Visit: Payer: Self-pay

## 2021-11-17 ENCOUNTER — Emergency Department (HOSPITAL_COMMUNITY): Payer: BC Managed Care – PPO

## 2021-11-17 ENCOUNTER — Emergency Department (HOSPITAL_COMMUNITY)
Admission: EM | Admit: 2021-11-17 | Discharge: 2021-11-17 | Discharge status: Against Medical Advice | Payer: BC Managed Care – PPO | Attending: Emergency Medicine | Admitting: Emergency Medicine

## 2021-11-17 DIAGNOSIS — Z5329 Procedure and treatment not carried out because of patient's decision for other reasons: Secondary | ICD-10-CM | POA: Insufficient documentation

## 2021-11-17 DIAGNOSIS — Y9373 Activity, racquet and hand sports: Secondary | ICD-10-CM | POA: Diagnosis not present

## 2021-11-17 DIAGNOSIS — R079 Chest pain, unspecified: Secondary | ICD-10-CM | POA: Diagnosis not present

## 2021-11-17 DIAGNOSIS — R0789 Other chest pain: Secondary | ICD-10-CM | POA: Diagnosis not present

## 2021-11-17 DIAGNOSIS — R072 Precordial pain: Secondary | ICD-10-CM | POA: Diagnosis not present

## 2021-11-17 DIAGNOSIS — R0602 Shortness of breath: Secondary | ICD-10-CM | POA: Diagnosis not present

## 2021-11-17 DIAGNOSIS — R002 Palpitations: Secondary | ICD-10-CM | POA: Diagnosis not present

## 2021-11-17 LAB — CBC
HCT: 44.1 % (ref 36.0–46.0)
Hemoglobin: 14.8 g/dL (ref 12.0–15.0)
MCH: 31.6 pg (ref 26.0–34.0)
MCHC: 33.6 g/dL (ref 30.0–36.0)
MCV: 94 fL (ref 80.0–100.0)
Platelets: 291 10*3/uL (ref 150–400)
RBC: 4.69 MIL/uL (ref 3.87–5.11)
RDW: 12.7 % (ref 11.5–15.5)
WBC: 8.4 10*3/uL (ref 4.0–10.5)
nRBC: 0 % (ref 0.0–0.2)

## 2021-11-17 LAB — BASIC METABOLIC PANEL
Anion gap: 10 (ref 5–15)
BUN: 13 mg/dL (ref 6–20)
CO2: 23 mmol/L (ref 22–32)
Calcium: 9.6 mg/dL (ref 8.9–10.3)
Chloride: 107 mmol/L (ref 98–111)
Creatinine, Ser: 0.67 mg/dL (ref 0.44–1.00)
GFR, Estimated: >60 mL/min (ref >60)
Glucose, Bld: 91 mg/dL (ref 70–99)
Potassium: 4.1 mmol/L (ref 3.5–5.1)
Sodium: 140 mmol/L (ref 135–145)

## 2021-11-17 LAB — TROPONIN I (HIGH SENSITIVITY)
Troponin I (High Sensitivity): <2 ng/L (ref <18)
Troponin I (High Sensitivity): 2 ng/L (ref <18)

## 2021-11-17 NOTE — ED Triage Notes (Signed)
The pt has had chest pain since 1700 today  no previous history  some nausea dizziness and asob crying at present

## 2021-11-17 NOTE — ED Notes (Signed)
Pt stated she was leaving AMA 

## 2021-11-17 NOTE — ED Provider Triage Note (Signed)
Emergency Medicine Provider Triage Evaluation Note  ADAMARIZ GILLOTT , a 51 y.o. female  was evaluated in triage.  Pt complains of sternal chest pain onset 5 PM today.  Notes that she was playing pickle ball when her symptoms started.  She felt some shortness of breath at the time.  Denies history of MI, stents, diaphoresis, vomiting.  Denies history of diabetes or hypertension.  Review of Systems  Positive:  Negative:   Physical Exam  BP (!) 136/101   Pulse 70   Temp 98 F (36.7 C) (Oral)   Resp (!) 22   SpO2 100%  Gen:   Awake, no distress   Resp:  Normal effort  MSK:   Moves extremities without difficulty  Other:  Mild sternal chest wall tenderness to palpation  Medical Decision Making  Medically screening exam initiated at 6:16 PM.  Appropriate orders placed.  Bernell Haynie Denicola was informed that the remainder of the evaluation will be completed by another provider, this initial triage assessment does not replace that evaluation, and the importance of remaining in the ED until their evaluation is complete.  Work-up initiated   Cristi Gwynn A, PA-C 11/17/21 1822

## 2021-11-20 DIAGNOSIS — K219 Gastro-esophageal reflux disease without esophagitis: Secondary | ICD-10-CM | POA: Diagnosis not present

## 2021-12-20 DIAGNOSIS — R9389 Abnormal findings on diagnostic imaging of other specified body structures: Secondary | ICD-10-CM | POA: Diagnosis not present

## 2021-12-20 DIAGNOSIS — N920 Excessive and frequent menstruation with regular cycle: Secondary | ICD-10-CM | POA: Diagnosis not present

## 2022-01-05 DIAGNOSIS — Z23 Encounter for immunization: Secondary | ICD-10-CM | POA: Diagnosis not present

## 2022-01-09 DIAGNOSIS — R222 Localized swelling, mass and lump, trunk: Secondary | ICD-10-CM | POA: Diagnosis not present

## 2022-01-12 DIAGNOSIS — Z3202 Encounter for pregnancy test, result negative: Secondary | ICD-10-CM | POA: Diagnosis not present

## 2022-01-12 DIAGNOSIS — N840 Polyp of corpus uteri: Secondary | ICD-10-CM | POA: Diagnosis not present

## 2022-01-24 DIAGNOSIS — M25511 Pain in right shoulder: Secondary | ICD-10-CM | POA: Diagnosis not present

## 2022-01-26 DIAGNOSIS — M25511 Pain in right shoulder: Secondary | ICD-10-CM | POA: Diagnosis not present

## 2022-01-29 DIAGNOSIS — M9905 Segmental and somatic dysfunction of pelvic region: Secondary | ICD-10-CM | POA: Diagnosis not present

## 2022-01-29 DIAGNOSIS — M9901 Segmental and somatic dysfunction of cervical region: Secondary | ICD-10-CM | POA: Diagnosis not present

## 2022-01-29 DIAGNOSIS — M9903 Segmental and somatic dysfunction of lumbar region: Secondary | ICD-10-CM | POA: Diagnosis not present

## 2022-01-29 DIAGNOSIS — M5386 Other specified dorsopathies, lumbar region: Secondary | ICD-10-CM | POA: Diagnosis not present

## 2022-01-29 DIAGNOSIS — M9902 Segmental and somatic dysfunction of thoracic region: Secondary | ICD-10-CM | POA: Diagnosis not present

## 2022-01-30 DIAGNOSIS — M25511 Pain in right shoulder: Secondary | ICD-10-CM | POA: Diagnosis not present

## 2022-10-17 ENCOUNTER — Encounter: Payer: Self-pay | Admitting: Family Medicine

## 2022-10-17 ENCOUNTER — Other Ambulatory Visit (HOSPITAL_COMMUNITY): Payer: Self-pay | Admitting: Family Medicine

## 2022-10-17 DIAGNOSIS — R109 Unspecified abdominal pain: Secondary | ICD-10-CM

## 2022-10-18 ENCOUNTER — Ambulatory Visit (HOSPITAL_COMMUNITY)
Admission: RE | Admit: 2022-10-18 | Discharge: 2022-10-18 | Disposition: A | Discharge status: Home / Self Care | Payer: No Typology Code available for payment source | Source: Ambulatory Visit | Attending: Family Medicine | Admitting: Family Medicine

## 2022-10-18 ENCOUNTER — Ambulatory Visit: Payer: No Typology Code available for payment source | Admitting: Urology

## 2022-10-18 ENCOUNTER — Other Ambulatory Visit (HOSPITAL_COMMUNITY): Payer: Self-pay | Admitting: Family Medicine

## 2022-10-18 ENCOUNTER — Encounter: Payer: Self-pay | Admitting: Urology

## 2022-10-18 VITALS — BP 111/69 | HR 70 | Ht 63.0 in | Wt 112.0 lb

## 2022-10-18 DIAGNOSIS — R109 Unspecified abdominal pain: Secondary | ICD-10-CM | POA: Insufficient documentation

## 2022-10-18 DIAGNOSIS — M545 Low back pain, unspecified: Secondary | ICD-10-CM

## 2022-10-18 DIAGNOSIS — N200 Calculus of kidney: Secondary | ICD-10-CM | POA: Diagnosis not present

## 2022-10-18 DIAGNOSIS — R3129 Other microscopic hematuria: Secondary | ICD-10-CM | POA: Diagnosis not present

## 2022-10-18 DIAGNOSIS — N281 Cyst of kidney, acquired: Secondary | ICD-10-CM

## 2022-10-18 LAB — URINALYSIS, ROUTINE W REFLEX MICROSCOPIC
Bilirubin, UA: NEGATIVE
Glucose, UA: NEGATIVE
Ketones, UA: NEGATIVE
Leukocytes,UA: NEGATIVE
Nitrite, UA: NEGATIVE
Specific Gravity, UA: >1.03 — ABNORMAL HIGH (ref 1.005–1.030)
Urobilinogen, Ur: 0.2 mg/dL (ref 0.2–1.0)
pH, UA: 5.5 (ref 5.0–7.5)

## 2022-10-18 LAB — MICROSCOPIC EXAMINATION

## 2022-10-18 NOTE — Progress Notes (Signed)
Assessment: 1. Nephrolithiasis   2. Microscopic hematuria   3. Acute left-sided low back pain without sciatica   4. Renal cyst, left     Plan: I personally reviewed the patient's chart including provider notes, lab and imaging results. I personally viewed the CT study from 10/18/2022 with results as noted below.  I discussed these results with the patient in detail today. Her CT scan shows a small nonobstructing stone in the left kidney.  This would not be an explanation for her left-sided back pain.  Her back pain is located in the left paraspinal area and is likely musculoskeletal. Urine culture sent today Today I had a discussion with the patient regarding the findings of microscopic hematuria including the implications and differential diagnoses associated with it.  I also discussed recommendations for further evaluation including the rationale for upper tract imaging and cystoscopy.  I discussed the nature of these procedures including potential risk and complications.  The patient expressed an understanding of these issues. Will contact with results of culture and recommendations for follow-up.   Chief Complaint:  Chief Complaint  Patient presents with   Nephrolithiasis    History of Present Illness:  Kristina Hahn is a 52 y.o. female who is seen in consultation from Shireen Quan, DO for evaluation of nephrolithiasis.  She reports a 2-3-day history of left-sided back pain.  The pain has been fairly constant in nature.  She has had some relief with a heating pad and has noted increased pain with walking.  She has a prior history of kidney stones and is concerned that this is the cause of her pain.  No fevers or chills.  She has had some nausea with increased pain.  No dysuria or gross hematuria.  CT imaging done earlier this morning shows a 2 mm calculus in the lower pole of the left kidney without evidence of obstruction and stable left renal cyst.   Past Medical History:   Past Medical History:  Diagnosis Date   Kidney stones     Past Surgical History:  Past Surgical History:  Procedure Laterality Date   KNEE ARTHROSCOPY W/ ACL RECONSTRUCTION  2009   OVARIAN CYST REMOVAL     right    Allergies:  Allergies  Allergen Reactions   Aspirin    Ketorolac Tromethamine Other (See Comments)   Sulfa Antibiotics     Family History:  No family history on file.  Social History:  Social History   Tobacco Use   Smoking status: Never   Smokeless tobacco: Never  Substance Use Topics   Alcohol use: No   Drug use: No    Review of symptoms:  Constitutional:  Negative for unexplained weight loss, night sweats, fever, chills ENT:  Negative for nose bleeds, sinus pain, painful swallowing CV:  Negative for chest pain, shortness of breath, exercise intolerance, palpitations, loss of consciousness Resp:  Negative for cough, wheezing, shortness of breath GI:  Negative for nausea, vomiting, diarrhea, bloody stools GU:  Positives noted in HPI; otherwise negative for gross hematuria, dysuria, urinary incontinence Neuro:  Negative for seizures, poor balance, limb weakness, slurred speech Psych:  Negative for lack of energy, depression, anxiety Endocrine:  Negative for polydipsia, polyuria, symptoms of hypoglycemia (dizziness, hunger, sweating) Hematologic:  Negative for anemia, purpura, petechia, prolonged or excessive bleeding, use of anticoagulants  Allergic:  Negative for difficulty breathing or choking as a result of exposure to anything; no shellfish allergy; no allergic response (rash/itch) to materials, foods  Physical  exam: BP 111/69   Pulse 70   Ht 5\' 3"  (1.6 m)   Wt 112 lb (50.8 kg)   BMI 19.84 kg/m  GENERAL APPEARANCE:  Well appearing, well developed, well nourished, NAD HEENT: Atraumatic, Normocephalic, oropharynx clear. NECK: Supple without lymphadenopathy or thyromegaly. LUNGS: Clear to auscultation bilaterally. HEART: Regular Rate and Rhythm  without murmurs, gallops, or rubs. ABDOMEN: Soft, non-tender, No Masses. EXTREMITIES: Moves all extremities well.  Without clubbing, cyanosis, or edema. NEUROLOGIC:  Alert and oriented x 3, normal gait, CN II-XII grossly intact.  MENTAL STATUS:  Appropriate. BACK:  Tender to palpation in left paraspinal area.  No CVAT SKIN:  Warm, dry and intact.    Results: U/A:  0-5 WBC, 3-10 RBC, many bacteria

## 2022-10-20 LAB — URINE CULTURE: Organism ID, Bacteria: NO GROWTH

## 2023-05-22 ENCOUNTER — Emergency Department (HOSPITAL_BASED_OUTPATIENT_CLINIC_OR_DEPARTMENT_OTHER)

## 2023-05-22 ENCOUNTER — Other Ambulatory Visit (HOSPITAL_BASED_OUTPATIENT_CLINIC_OR_DEPARTMENT_OTHER): Payer: Self-pay

## 2023-05-22 ENCOUNTER — Encounter (HOSPITAL_BASED_OUTPATIENT_CLINIC_OR_DEPARTMENT_OTHER): Payer: Self-pay

## 2023-05-22 ENCOUNTER — Emergency Department (HOSPITAL_BASED_OUTPATIENT_CLINIC_OR_DEPARTMENT_OTHER)
Admission: EM | Admit: 2023-05-22 | Discharge: 2023-05-22 | Disposition: A | Discharge status: Home / Self Care | Attending: Emergency Medicine | Admitting: Emergency Medicine

## 2023-05-22 ENCOUNTER — Other Ambulatory Visit: Payer: Self-pay

## 2023-05-22 DIAGNOSIS — D259 Leiomyoma of uterus, unspecified: Secondary | ICD-10-CM | POA: Insufficient documentation

## 2023-05-22 DIAGNOSIS — R1031 Right lower quadrant pain: Secondary | ICD-10-CM | POA: Diagnosis present

## 2023-05-22 LAB — URINALYSIS, ROUTINE W REFLEX MICROSCOPIC
Bacteria, UA: NONE SEEN
Bilirubin Urine: NEGATIVE
Glucose, UA: NEGATIVE mg/dL
Ketones, ur: NEGATIVE mg/dL
Leukocytes,Ua: NEGATIVE
Nitrite: NEGATIVE
Protein, ur: NEGATIVE mg/dL
Specific Gravity, Urine: 1.012 (ref 1.005–1.030)
pH: 6.5 (ref 5.0–8.0)

## 2023-05-22 LAB — COMPREHENSIVE METABOLIC PANEL
ALT: 11 U/L (ref 0–44)
AST: 15 U/L (ref 15–41)
Albumin: 4.8 g/dL (ref 3.5–5.0)
Alkaline Phosphatase: 40 U/L (ref 38–126)
Anion gap: 8 (ref 5–15)
BUN: 11 mg/dL (ref 6–20)
CO2: 28 mmol/L (ref 22–32)
Calcium: 9.7 mg/dL (ref 8.9–10.3)
Chloride: 106 mmol/L (ref 98–111)
Creatinine, Ser: 0.62 mg/dL (ref 0.44–1.00)
GFR, Estimated: >60 mL/min (ref >60)
Glucose, Bld: 96 mg/dL (ref 70–99)
Potassium: 3.7 mmol/L (ref 3.5–5.1)
Sodium: 142 mmol/L (ref 135–145)
Total Bilirubin: 0.6 mg/dL (ref 0.0–1.2)
Total Protein: 7.2 g/dL (ref 6.5–8.1)

## 2023-05-22 LAB — CBC
HCT: 42.4 % (ref 36.0–46.0)
Hemoglobin: 13.9 g/dL (ref 12.0–15.0)
MCH: 31.2 pg (ref 26.0–34.0)
MCHC: 32.8 g/dL (ref 30.0–36.0)
MCV: 95.1 fL (ref 80.0–100.0)
Platelets: 302 10*3/uL (ref 150–400)
RBC: 4.46 MIL/uL (ref 3.87–5.11)
RDW: 12.8 % (ref 11.5–15.5)
WBC: 6.7 10*3/uL (ref 4.0–10.5)
nRBC: 0 % (ref 0.0–0.2)

## 2023-05-22 LAB — LIPASE, BLOOD: Lipase: 72 U/L — ABNORMAL HIGH (ref 11–51)

## 2023-05-22 LAB — PREGNANCY, URINE: Preg Test, Ur: NEGATIVE

## 2023-05-22 MED ORDER — IOHEXOL 300 MG/ML  SOLN
100.0000 mL | Freq: Once | INTRAMUSCULAR | Status: AC | PRN
  Administered 2023-05-22: 80 mL via INTRAVENOUS

## 2023-05-22 MED ORDER — ONDANSETRON HCL 4 MG/2ML IJ SOLN
4.0000 mg | Freq: Once | INTRAMUSCULAR | Status: AC
  Administered 2023-05-22: 4 mg via INTRAVENOUS
  Filled 2023-05-22: qty 2

## 2023-05-22 MED ORDER — MORPHINE SULFATE (PF) 4 MG/ML IV SOLN
4.0000 mg | Freq: Once | INTRAVENOUS | Status: AC
  Administered 2023-05-22: 4 mg via INTRAVENOUS
  Filled 2023-05-22: qty 1

## 2023-05-22 MED ORDER — OXYCODONE-ACETAMINOPHEN 5-325 MG PO TABS
1.0000 | ORAL_TABLET | Freq: Four times a day (QID) | ORAL | 0 refills | Status: AC | PRN
  Filled 2023-05-22: qty 12, 3d supply, fill #0

## 2023-05-22 MED ORDER — OXYCODONE-ACETAMINOPHEN 5-325 MG PO TABS
1.0000 | ORAL_TABLET | Freq: Once | ORAL | Status: AC
  Administered 2023-05-22: 1 via ORAL
  Filled 2023-05-22: qty 1

## 2023-05-22 NOTE — Discharge Instructions (Signed)
 Please follow-up with your OB/GYN for likely uterine fibroids.  Prescription for pain medication sent to your pharmacy.

## 2023-05-22 NOTE — ED Triage Notes (Signed)
 Patient arrives POV with complaints of lower abdominal pain. Patient had an ultrasound and they did not find anything, but she was placed on antibiotics due to "fluid being near her uterus" or in case she has a diverticulitis.  Rates pain 9/10.

## 2023-05-22 NOTE — ED Notes (Signed)
Gave patient a urine cup. ?

## 2023-05-22 NOTE — ED Provider Notes (Signed)
 [facility] Provider Note   CSN: [id number] Arrival date & time: 05/22/23  4098     History  Chief Complaint  Patient presents with   Abdominal Pain    Kristina Hahn is a 53 y.o. female.  Patient is a 53 year old female presenting for complaints of abdominal pain.  Patient admits to diffuse lower abdominal pain x 1 month.  She has a previous history of ovarian cysts with removal of the right ovary.  She followed with her OB/GYN after the pain and had a pelvic exam and a pelvic ultrasound.  Her pelvic ultrasound results were otherwise stable.  She has had continued remittent, severe, right lower quadrant abdominal pain since.  She has no associated nausea, vomiting, diarrhea, constipation.  She denies vaginal pain, vaginal discharge, or vaginal pruritus.  She admits to current menstrual bleeding.  The history is provided by the patient. No language interpreter was used.  Abdominal Pain Associated symptoms: no chest pain, no chills, no cough, no dysuria, no fever, no hematuria, no shortness of breath, no sore throat and no vomiting        Home Medications Prior to Admission medications   Medication Sig Start Date End Date Taking? Authorizing Provider  oxyCODONE-acetaminophen (PERCOCET/ROXICET) 5-325 MG tablet Take 1 tablet by mouth every 6 (six) hours as needed for up to 3 days for severe pain (pain score 7-10). 05/22/23 05/25/23 Yes Edwin Dada P, DO  diazepam (VALIUM) 5 MG tablet 1 PO 1 hour before MRI, repeat prn Patient not taking: Reported on 07/20/2020 06/13/20   Hilts, Casimiro Needle, MD  methocarbamol (ROBAXIN) 500 MG tablet  09/30/19   [provider]  naproxen (NAPROSYN) 500 MG tablet Take 500 mg by mouth 2 (two) times daily as needed. 10/06/19   [provider]  tamsulosin (FLOMAX) 0.4 MG CAPS capsule Take 0.4 mg by mouth daily. 10/17/22   [provider]      Allergies    Aspirin, Sulfa antibiotics, and  Tramadol    Review of Systems   Review of Systems  Constitutional:  Negative for chills and fever.  HENT:  Negative for ear pain and sore throat.   Eyes:  Negative for pain and visual disturbance.  Respiratory:  Negative for cough and shortness of breath.   Cardiovascular:  Negative for chest pain and palpitations.  Gastrointestinal:  Positive for abdominal pain. Negative for vomiting.  Genitourinary:  Negative for dysuria and hematuria.  Musculoskeletal:  Negative for arthralgias and back pain.  Skin:  Negative for color change and rash.  Neurological:  Negative for seizures and syncope.  All other systems reviewed and are negative.   Physical Exam Updated Vital Signs BP 129/80   Pulse 63   Temp 98.5 F (36.9 C) (Oral)   Resp 17   Ht 5\' 3"  (1.6 m)   Wt 49.9 kg   SpO2 95%   BMI 19.49 kg/m  Physical Exam Vitals and nursing note reviewed.  Constitutional:      General: She is not in acute distress.    Appearance: She is well-developed.  HENT:     Head: Normocephalic and atraumatic.  Eyes:     Conjunctiva/sclera: Conjunctivae normal.  Cardiovascular:     Rate and Rhythm: Normal rate and regular rhythm.     Heart sounds: No murmur heard. Pulmonary:     Effort: Pulmonary effort is normal. No respiratory distress.     Breath sounds: Normal breath sounds.  Abdominal:  Palpations: Abdomen is soft.     Tenderness: There is abdominal tenderness in the right lower quadrant. There is guarding. There is no rebound.  Musculoskeletal:        General: No swelling.     Cervical back: Neck supple.  Skin:    General: Skin is warm and dry.     Capillary Refill: Capillary refill takes less than 2 seconds.  Neurological:     Mental Status: She is alert.  Psychiatric:        Mood and Affect: Mood normal.     ED Results / Procedures / Treatments   Labs (all labs ordered are listed, but only abnormal results are displayed) Labs Reviewed  LIPASE, BLOOD - Abnormal; Notable for  the following components:      Result Value   Lipase 72 (*)    All other components within normal limits  URINALYSIS, ROUTINE W REFLEX MICROSCOPIC - Abnormal; Notable for the following components:   Hgb urine dipstick MODERATE (*)    All other components within normal limits  COMPREHENSIVE METABOLIC PANEL  CBC  PREGNANCY, URINE    EKG None  Radiology CT ABDOMEN PELVIS W CONTRAST Result Date: 05/22/2023 CLINICAL DATA:  Lower abdominal pain. EXAM: CT ABDOMEN AND PELVIS WITH CONTRAST TECHNIQUE: Multidetector CT imaging of the abdomen and pelvis was performed using the standard protocol following bolus administration of intravenous contrast. RADIATION DOSE REDUCTION: This exam was performed according to the departmental dose-optimization program which includes automated exposure control, adjustment of the mA and/or kV according to patient size and/or use of iterative reconstruction technique. CONTRAST:  80mL OMNIPAQUE IOHEXOL 300 MG/ML  SOLN COMPARISON:  October 18, 2022 FINDINGS: Lower chest: No acute abnormality. Hepatobiliary: No focal liver abnormality is seen. No gallstones, gallbladder wall thickening, or biliary dilatation. Pancreas: Unremarkable. No pancreatic ductal dilatation or surrounding inflammatory changes. Spleen: Normal in size without focal abnormality. Adrenals/Urinary Tract: Adrenal glands are unremarkable. Kidneys are normal in size, without obstructing renal calculi or hydronephrosis. A 3 mm nonobstructing renal calculus is seen within the left kidney. A 4.8 cm simple cyst is seen within the left kidney. The urinary bladder is poorly distended and subsequently limited in evaluation. Stomach/Bowel: Stomach is within normal limits. Appendix appears normal. No evidence of bowel wall thickening, distention, or inflammatory changes. Vascular/Lymphatic: No significant vascular findings are present. No enlarged abdominal or pelvic lymph nodes. Reproductive: A 1.5 cm diameter  low-attenuation uterine fibroid is suspected within the anterior aspect of the uterine parenchyma on the left. The bilateral adnexa are unremarkable. The Other: No abdominal wall hernia or abnormality. No abdominopelvic ascites. Musculoskeletal: No acute or significant osseous findings. IMPRESSION: 1. 3 mm nonobstructing left renal calculus. 2. 4.8 cm simple left renal cyst. No follow-up imaging is recommended. This recommendation follows ACR consensus guidelines: Management of the Incidental Renal Mass on CT: A White Paper of the ACR Incidental Findings Committee. J Am Coll Radiol 2018;15:264-273. 3. Suspected small uterine fibroid. Correlation with nonemergent pelvic ultrasound is recommended. Electronically Signed   By: Aram Candela M.D.   On: 05/22/2023 11:03    Procedures Procedures    Medications Ordered in ED Medications  oxyCODONE-acetaminophen (PERCOCET/ROXICET) 5-325 MG per tablet 1 tablet (has no administration in time range)  morphine (PF) 4 MG/ML injection 4 mg (4 mg Intravenous Given 05/22/23 1052)  ondansetron (ZOFRAN) injection 4 mg (4 mg Intravenous Given 05/22/23 1051)  iohexol (OMNIPAQUE) 300 MG/ML solution 100 mL (80 mLs Intravenous Contrast Given 05/22/23 1031)  ED Course/ Medical Decision Making/ A&P                                 Medical Decision Making Amount and/or Complexity of Data Reviewed Labs: ordered. Radiology: ordered.  Risk Prescription drug management.   53 year old female presenting for lower abdominal pain x 1 month.  Patient is alert and oriented x 3, no acute distress, afebrile, so vital signs.  Physical exam demonstrates no distention or rigidity of the abdomen.  Tenderness palpation of the right lower quadrant only.  Differential diagnosis includes but is not limited to appendicitis, PMS symptoms, colitis, UTI, nephrolithiasis, etc.  Medication given for pain management.  Laboratory studies demonstrate no leukocytosis.  No signs or  symptoms of sepsis.  Stable liver profile, lipase, and renal function.  Patient had a pelvic ultrasound for these symptoms over the last month that demonstrated no acute process.  UA demonstrates no UTI.  CT abdomen and pelvis ordered and pending.  CT abdomen and pelvis with IV contrast demonstrates: 1. 3 mm nonobstructing left renal calculus. 2. 4.8 cm simple left renal cyst. No follow-up imaging is recommended. This recommendation follows ACR consensus guidelines: Management of the Incidental Renal Mass on CT: A White Paper of the ACR Incidental Findings Committee. J Am Coll Radiol 2018;15:264-273. 3. Suspected small uterine fibroid. Correlation with nonemergent pelvic ultrasound is recommended.  Symptoms likely secondary to uterine fibroid.  Patient had a pelvic ultrasound completed at her OB/GYN office yesterday.  She was found to have a possible polyp versus fibroid at that time.  This information is from the patient.  I am unable to see her ultrasound results in her MyChart account.  We have attempted to call the office and are unable to get a hold of the interpretation.  Patient recommended for close follow-up with her OB/GYN for further guidance.  At this time there is no other signs or symptoms of infection or life-threatening etiologies of lower abdominal pain.  No appendicitis.  No diverticulitis.  No bowel obstruction.  No colitis.  No urinary tract infection.  Patient was found to have a left renal cyst but that is unlikely causing patient's symptoms due to its location.  Patient in no distress and overall condition improved here in the ED. Detailed discussions were had with the patient regarding current findings, and need for close f/u with PCP or on call doctor. The patient has been instructed to return immediately if the symptoms worsen in any way for re-evaluation. Patient verbalized understanding and is in agreement with current care plan. All questions answered prior to  discharge.         Final Clinical Impression(s) / ED Diagnoses Final diagnoses:  RLQ abdominal pain  Uterine leiomyoma, unspecified location    Rx / DC Orders ED Discharge Orders          Ordered    oxyCODONE-acetaminophen (PERCOCET/ROXICET) 5-325 MG tablet  Every 6 hours PRN        05/22/23 1224              Franne Forts, DO 05/22/23 1225
